# Patient Record
Sex: Female | Born: 1960 | Race: White | Hispanic: No | Marital: Married | Smoking: Never smoker
Health system: Southern US, Community
[De-identification: ages and names within clinical notes are randomized; demographics above are authoritative.]

## PROBLEM LIST (undated history)

## (undated) DIAGNOSIS — M722 Plantar fascial fibromatosis: Secondary | ICD-10-CM

## (undated) DIAGNOSIS — J309 Allergic rhinitis, unspecified: Secondary | ICD-10-CM

## (undated) DIAGNOSIS — S52123A Displaced fracture of head of unspecified radius, initial encounter for closed fracture: Secondary | ICD-10-CM

## (undated) DIAGNOSIS — G4700 Insomnia, unspecified: Secondary | ICD-10-CM

## (undated) DIAGNOSIS — J45909 Unspecified asthma, uncomplicated: Secondary | ICD-10-CM

## (undated) DIAGNOSIS — M1990 Unspecified osteoarthritis, unspecified site: Secondary | ICD-10-CM

## (undated) DIAGNOSIS — D509 Iron deficiency anemia, unspecified: Secondary | ICD-10-CM

## (undated) DIAGNOSIS — G2581 Restless legs syndrome: Secondary | ICD-10-CM

## (undated) DIAGNOSIS — M8580 Other specified disorders of bone density and structure, unspecified site: Secondary | ICD-10-CM

## (undated) DIAGNOSIS — F419 Anxiety disorder, unspecified: Secondary | ICD-10-CM

## (undated) DIAGNOSIS — K589 Irritable bowel syndrome without diarrhea: Secondary | ICD-10-CM

## (undated) DIAGNOSIS — K219 Gastro-esophageal reflux disease without esophagitis: Secondary | ICD-10-CM

## (undated) HISTORY — DX: Iron deficiency anemia, unspecified: D50.9

## (undated) HISTORY — DX: Unspecified osteoarthritis, unspecified site: M19.90

## (undated) HISTORY — PX: VAGINAL HYSTERECTOMY: SUR661

## (undated) HISTORY — DX: Allergic rhinitis, unspecified: J30.9

## (undated) HISTORY — DX: Restless legs syndrome: G25.81

## (undated) HISTORY — DX: Plantar fascial fibromatosis: M72.2

## (undated) HISTORY — DX: Displaced fracture of head of unspecified radius, initial encounter for closed fracture: S52.123A

## (undated) HISTORY — PX: OTHER SURGICAL HISTORY: SHX169

## (undated) HISTORY — PX: BREAST EXCISIONAL BIOPSY: SUR124

## (undated) HISTORY — PX: APPENDECTOMY: SHX54

## (undated) HISTORY — DX: Gastro-esophageal reflux disease without esophagitis: K21.9

## (undated) HISTORY — DX: Anxiety disorder, unspecified: F41.9

## (undated) HISTORY — DX: Irritable bowel syndrome, unspecified: K58.9

## (undated) HISTORY — DX: Insomnia, unspecified: G47.00

## (undated) HISTORY — DX: Other specified disorders of bone density and structure, unspecified site: M85.80

## (undated) HISTORY — PX: BREAST LUMPECTOMY: SHX2

## (undated) HISTORY — PX: OOPHORECTOMY: SHX86

---

## 1998-07-17 ENCOUNTER — Emergency Department (HOSPITAL_COMMUNITY): Admission: EM | Admit: 1998-07-17 | Discharge: 1998-07-17 | Payer: Self-pay | Admitting: Emergency Medicine

## 1998-09-18 ENCOUNTER — Inpatient Hospital Stay (HOSPITAL_COMMUNITY): Admission: EM | Admit: 1998-09-18 | Discharge: 1998-09-22 | Payer: Self-pay | Admitting: Emergency Medicine

## 1998-09-18 ENCOUNTER — Encounter: Payer: Self-pay | Admitting: Surgery

## 1998-11-01 ENCOUNTER — Ambulatory Visit (HOSPITAL_COMMUNITY): Admission: RE | Admit: 1998-11-01 | Discharge: 1998-11-01 | Payer: Self-pay | Admitting: Obstetrics and Gynecology

## 1998-11-01 ENCOUNTER — Encounter: Payer: Self-pay | Admitting: Obstetrics and Gynecology

## 1999-04-19 ENCOUNTER — Ambulatory Visit (HOSPITAL_COMMUNITY): Admission: RE | Admit: 1999-04-19 | Discharge: 1999-04-19 | Payer: Self-pay | Admitting: Obstetrics and Gynecology

## 1999-04-19 ENCOUNTER — Encounter: Payer: Self-pay | Admitting: Obstetrics and Gynecology

## 1999-05-10 ENCOUNTER — Ambulatory Visit (HOSPITAL_BASED_OUTPATIENT_CLINIC_OR_DEPARTMENT_OTHER): Admission: RE | Admit: 1999-05-10 | Discharge: 1999-05-10 | Payer: Self-pay | Admitting: Surgery

## 1999-10-12 ENCOUNTER — Other Ambulatory Visit: Admission: RE | Admit: 1999-10-12 | Discharge: 1999-10-12 | Payer: Self-pay | Admitting: Obstetrics and Gynecology

## 2000-01-18 ENCOUNTER — Encounter: Admission: RE | Admit: 2000-01-18 | Discharge: 2000-01-18 | Payer: Self-pay | Admitting: Internal Medicine

## 2000-01-21 ENCOUNTER — Encounter: Payer: Self-pay | Admitting: Internal Medicine

## 2000-01-21 ENCOUNTER — Encounter: Admission: RE | Admit: 2000-01-21 | Discharge: 2000-01-21 | Payer: Self-pay | Admitting: Internal Medicine

## 2000-05-27 ENCOUNTER — Other Ambulatory Visit: Admission: RE | Admit: 2000-05-27 | Discharge: 2000-05-27 | Payer: Self-pay | Admitting: Obstetrics and Gynecology

## 2000-05-28 ENCOUNTER — Encounter: Admission: RE | Admit: 2000-05-28 | Discharge: 2000-05-28 | Payer: Self-pay | Admitting: Obstetrics and Gynecology

## 2000-05-28 ENCOUNTER — Encounter: Payer: Self-pay | Admitting: Obstetrics and Gynecology

## 2000-06-12 ENCOUNTER — Ambulatory Visit (HOSPITAL_BASED_OUTPATIENT_CLINIC_OR_DEPARTMENT_OTHER): Admission: RE | Admit: 2000-06-12 | Discharge: 2000-06-12 | Payer: Self-pay | Admitting: General Surgery

## 2000-06-12 ENCOUNTER — Encounter (INDEPENDENT_AMBULATORY_CARE_PROVIDER_SITE_OTHER): Payer: Self-pay | Admitting: *Deleted

## 2000-10-13 ENCOUNTER — Other Ambulatory Visit: Admission: RE | Admit: 2000-10-13 | Discharge: 2000-10-13 | Payer: Self-pay | Admitting: Obstetrics and Gynecology

## 2001-04-14 ENCOUNTER — Encounter: Payer: Self-pay | Admitting: Obstetrics and Gynecology

## 2001-04-14 ENCOUNTER — Encounter: Admission: RE | Admit: 2001-04-14 | Discharge: 2001-04-14 | Payer: Self-pay | Admitting: Obstetrics and Gynecology

## 2001-10-14 ENCOUNTER — Other Ambulatory Visit: Admission: RE | Admit: 2001-10-14 | Discharge: 2001-10-14 | Payer: Self-pay | Admitting: Obstetrics and Gynecology

## 2001-12-25 ENCOUNTER — Encounter: Admission: RE | Admit: 2001-12-25 | Discharge: 2001-12-25 | Payer: Self-pay | Admitting: Internal Medicine

## 2001-12-25 ENCOUNTER — Encounter: Payer: Self-pay | Admitting: Internal Medicine

## 2002-02-12 ENCOUNTER — Encounter: Admission: RE | Admit: 2002-02-12 | Discharge: 2002-02-12 | Payer: Self-pay | Admitting: Internal Medicine

## 2002-02-12 ENCOUNTER — Encounter: Payer: Self-pay | Admitting: Internal Medicine

## 2002-04-15 ENCOUNTER — Encounter: Admission: RE | Admit: 2002-04-15 | Discharge: 2002-04-15 | Payer: Self-pay | Admitting: Obstetrics and Gynecology

## 2002-04-15 ENCOUNTER — Encounter: Payer: Self-pay | Admitting: Obstetrics and Gynecology

## 2002-10-26 ENCOUNTER — Other Ambulatory Visit: Admission: RE | Admit: 2002-10-26 | Discharge: 2002-10-26 | Payer: Self-pay | Admitting: Obstetrics and Gynecology

## 2003-01-27 ENCOUNTER — Encounter: Payer: Self-pay | Admitting: Internal Medicine

## 2003-01-27 ENCOUNTER — Ambulatory Visit (HOSPITAL_COMMUNITY): Admission: RE | Admit: 2003-01-27 | Discharge: 2003-01-27 | Payer: Self-pay | Admitting: Internal Medicine

## 2003-03-17 ENCOUNTER — Encounter: Payer: Self-pay | Admitting: *Deleted

## 2003-03-17 ENCOUNTER — Ambulatory Visit (HOSPITAL_COMMUNITY): Admission: RE | Admit: 2003-03-17 | Discharge: 2003-03-17 | Payer: Self-pay | Admitting: *Deleted

## 2003-04-19 ENCOUNTER — Encounter: Payer: Self-pay | Admitting: Obstetrics and Gynecology

## 2003-04-19 ENCOUNTER — Encounter: Admission: RE | Admit: 2003-04-19 | Discharge: 2003-04-19 | Payer: Self-pay | Admitting: Obstetrics and Gynecology

## 2003-11-22 ENCOUNTER — Other Ambulatory Visit: Admission: RE | Admit: 2003-11-22 | Discharge: 2003-11-22 | Payer: Self-pay | Admitting: Obstetrics and Gynecology

## 2004-01-20 ENCOUNTER — Emergency Department (HOSPITAL_COMMUNITY): Admission: AD | Admit: 2004-01-20 | Discharge: 2004-01-20 | Payer: Self-pay | Admitting: Family Medicine

## 2004-03-09 HISTORY — PX: KNEE SURGERY: SHX244

## 2004-04-26 ENCOUNTER — Encounter: Admission: RE | Admit: 2004-04-26 | Discharge: 2004-04-26 | Payer: Self-pay | Admitting: Obstetrics and Gynecology

## 2004-10-07 ENCOUNTER — Emergency Department (HOSPITAL_COMMUNITY): Admission: EM | Admit: 2004-10-07 | Discharge: 2004-10-07 | Payer: Self-pay | Admitting: Emergency Medicine

## 2004-11-22 ENCOUNTER — Other Ambulatory Visit: Admission: RE | Admit: 2004-11-22 | Discharge: 2004-11-22 | Payer: Self-pay | Admitting: Obstetrics and Gynecology

## 2005-04-29 ENCOUNTER — Encounter: Admission: RE | Admit: 2005-04-29 | Discharge: 2005-04-29 | Payer: Self-pay | Admitting: Obstetrics and Gynecology

## 2005-08-14 ENCOUNTER — Ambulatory Visit: Payer: Self-pay | Admitting: Internal Medicine

## 2005-09-11 ENCOUNTER — Ambulatory Visit: Payer: Self-pay | Admitting: Internal Medicine

## 2005-09-12 ENCOUNTER — Ambulatory Visit: Payer: Self-pay | Admitting: Internal Medicine

## 2005-11-11 ENCOUNTER — Ambulatory Visit: Payer: Self-pay | Admitting: Internal Medicine

## 2005-11-27 ENCOUNTER — Ambulatory Visit: Payer: Self-pay | Admitting: Internal Medicine

## 2005-12-04 ENCOUNTER — Other Ambulatory Visit: Admission: RE | Admit: 2005-12-04 | Discharge: 2005-12-04 | Payer: Self-pay | Admitting: Obstetrics and Gynecology

## 2005-12-06 ENCOUNTER — Encounter: Admission: RE | Admit: 2005-12-06 | Discharge: 2005-12-06 | Payer: Self-pay | Admitting: Obstetrics and Gynecology

## 2005-12-23 ENCOUNTER — Ambulatory Visit: Payer: Self-pay | Admitting: Internal Medicine

## 2006-01-24 ENCOUNTER — Ambulatory Visit: Payer: Self-pay | Admitting: Internal Medicine

## 2006-02-13 ENCOUNTER — Ambulatory Visit: Payer: Self-pay | Admitting: Internal Medicine

## 2006-04-09 ENCOUNTER — Encounter: Payer: Self-pay | Admitting: Surgery

## 2006-04-21 ENCOUNTER — Ambulatory Visit (HOSPITAL_COMMUNITY): Admission: RE | Admit: 2006-04-21 | Discharge: 2006-04-21 | Payer: Self-pay | Admitting: Obstetrics and Gynecology

## 2006-05-07 ENCOUNTER — Encounter: Admission: RE | Admit: 2006-05-07 | Discharge: 2006-05-07 | Payer: Self-pay | Admitting: Obstetrics and Gynecology

## 2006-06-10 ENCOUNTER — Ambulatory Visit: Payer: Self-pay | Admitting: Internal Medicine

## 2006-10-31 ENCOUNTER — Ambulatory Visit: Payer: Self-pay | Admitting: Internal Medicine

## 2006-12-05 ENCOUNTER — Other Ambulatory Visit: Admission: RE | Admit: 2006-12-05 | Discharge: 2006-12-05 | Payer: Self-pay | Admitting: Obstetrics and Gynecology

## 2007-01-21 ENCOUNTER — Inpatient Hospital Stay (HOSPITAL_COMMUNITY): Admission: AD | Admit: 2007-01-21 | Discharge: 2007-01-21 | Payer: Self-pay | Admitting: Obstetrics and Gynecology

## 2007-01-22 ENCOUNTER — Ambulatory Visit (HOSPITAL_BASED_OUTPATIENT_CLINIC_OR_DEPARTMENT_OTHER): Admission: RE | Admit: 2007-01-22 | Discharge: 2007-01-22 | Payer: Self-pay | Admitting: Obstetrics and Gynecology

## 2007-01-22 ENCOUNTER — Encounter (INDEPENDENT_AMBULATORY_CARE_PROVIDER_SITE_OTHER): Payer: Self-pay | Admitting: Specialist

## 2007-01-22 ENCOUNTER — Encounter (INDEPENDENT_AMBULATORY_CARE_PROVIDER_SITE_OTHER): Payer: Self-pay | Admitting: *Deleted

## 2007-04-09 ENCOUNTER — Ambulatory Visit: Payer: Self-pay | Admitting: Internal Medicine

## 2007-04-10 ENCOUNTER — Ambulatory Visit: Payer: Self-pay | Admitting: Internal Medicine

## 2007-05-15 ENCOUNTER — Encounter: Admission: RE | Admit: 2007-05-15 | Discharge: 2007-05-15 | Payer: Self-pay | Admitting: Obstetrics and Gynecology

## 2007-09-21 ENCOUNTER — Ambulatory Visit: Payer: Self-pay | Admitting: Internal Medicine

## 2007-12-10 HISTORY — PX: OTHER SURGICAL HISTORY: SHX169

## 2007-12-21 ENCOUNTER — Other Ambulatory Visit: Admission: RE | Admit: 2007-12-21 | Discharge: 2007-12-21 | Payer: Self-pay | Admitting: Obstetrics and Gynecology

## 2008-03-04 ENCOUNTER — Encounter (INDEPENDENT_AMBULATORY_CARE_PROVIDER_SITE_OTHER): Payer: Self-pay | Admitting: Urology

## 2008-03-04 ENCOUNTER — Ambulatory Visit (HOSPITAL_BASED_OUTPATIENT_CLINIC_OR_DEPARTMENT_OTHER): Admission: RE | Admit: 2008-03-04 | Discharge: 2008-03-04 | Payer: Self-pay | Admitting: Urology

## 2008-04-13 ENCOUNTER — Ambulatory Visit: Payer: Self-pay | Admitting: Gastroenterology

## 2008-04-13 LAB — CONVERTED CEMR LAB
AST: 25 units/L (ref 0–37)
Albumin: 4 g/dL (ref 3.5–5.2)
Bilirubin, Direct: 0.1 mg/dL (ref 0.0–0.3)
Calcium: 9.9 mg/dL (ref 8.4–10.5)
Chloride: 100 meq/L (ref 96–112)
Creatinine, Ser: 1 mg/dL (ref 0.4–1.2)
GFR calc non Af Amer: 63 mL/min
Glucose, Bld: 114 mg/dL — ABNORMAL HIGH (ref 70–99)
HCT: 42.4 % (ref 36.0–46.0)
Hemoglobin: 14.4 g/dL (ref 12.0–15.0)
MCHC: 33.9 g/dL (ref 30.0–36.0)
MCV: 89.1 fL (ref 78.0–100.0)
Monocytes Relative: 7.3 % (ref 3.0–12.0)
RBC: 4.75 M/uL (ref 3.87–5.11)
TSH: 2.15 microintl units/mL (ref 0.35–5.50)
Total Bilirubin: 0.6 mg/dL (ref 0.3–1.2)

## 2008-04-14 ENCOUNTER — Encounter: Payer: Self-pay | Admitting: Gastroenterology

## 2008-04-25 ENCOUNTER — Telehealth (INDEPENDENT_AMBULATORY_CARE_PROVIDER_SITE_OTHER): Payer: Self-pay | Admitting: *Deleted

## 2008-05-05 ENCOUNTER — Encounter: Payer: Self-pay | Admitting: Internal Medicine

## 2008-05-05 DIAGNOSIS — J939 Pneumothorax, unspecified: Secondary | ICD-10-CM | POA: Insufficient documentation

## 2008-05-05 DIAGNOSIS — N63 Unspecified lump in unspecified breast: Secondary | ICD-10-CM

## 2008-05-05 DIAGNOSIS — J330 Polyp of nasal cavity: Secondary | ICD-10-CM | POA: Insufficient documentation

## 2008-05-05 DIAGNOSIS — H1045 Other chronic allergic conjunctivitis: Secondary | ICD-10-CM

## 2008-05-05 DIAGNOSIS — J930 Spontaneous tension pneumothorax: Secondary | ICD-10-CM | POA: Insufficient documentation

## 2008-05-05 DIAGNOSIS — J302 Other seasonal allergic rhinitis: Secondary | ICD-10-CM

## 2008-05-09 ENCOUNTER — Encounter: Payer: Self-pay | Admitting: Gastroenterology

## 2008-05-10 ENCOUNTER — Telehealth: Payer: Self-pay | Admitting: Gastroenterology

## 2008-05-11 ENCOUNTER — Telehealth: Payer: Self-pay | Admitting: Internal Medicine

## 2008-05-12 ENCOUNTER — Encounter: Payer: Self-pay | Admitting: Gastroenterology

## 2008-05-12 ENCOUNTER — Ambulatory Visit (HOSPITAL_COMMUNITY): Admission: RE | Admit: 2008-05-12 | Discharge: 2008-05-12 | Payer: Self-pay | Admitting: Gastroenterology

## 2008-05-16 ENCOUNTER — Encounter: Admission: RE | Admit: 2008-05-16 | Discharge: 2008-05-16 | Payer: Self-pay | Admitting: Obstetrics and Gynecology

## 2008-05-18 ENCOUNTER — Ambulatory Visit: Payer: Self-pay | Admitting: Gastroenterology

## 2008-07-27 ENCOUNTER — Ambulatory Visit: Payer: Self-pay | Admitting: Internal Medicine

## 2008-08-02 ENCOUNTER — Telehealth: Payer: Self-pay | Admitting: Internal Medicine

## 2008-08-19 ENCOUNTER — Ambulatory Visit: Payer: Self-pay | Admitting: Internal Medicine

## 2008-08-30 ENCOUNTER — Ambulatory Visit: Payer: Self-pay | Admitting: Internal Medicine

## 2008-09-15 ENCOUNTER — Telehealth (INDEPENDENT_AMBULATORY_CARE_PROVIDER_SITE_OTHER): Payer: Self-pay | Admitting: *Deleted

## 2009-01-13 ENCOUNTER — Other Ambulatory Visit: Admission: RE | Admit: 2009-01-13 | Discharge: 2009-01-13 | Payer: Self-pay | Admitting: Obstetrics and Gynecology

## 2009-01-13 ENCOUNTER — Ambulatory Visit: Payer: Self-pay | Admitting: Obstetrics and Gynecology

## 2009-01-13 ENCOUNTER — Encounter: Payer: Self-pay | Admitting: Obstetrics and Gynecology

## 2009-01-24 ENCOUNTER — Ambulatory Visit: Payer: Self-pay | Admitting: Internal Medicine

## 2009-04-04 ENCOUNTER — Ambulatory Visit: Payer: Self-pay | Admitting: Internal Medicine

## 2009-05-17 ENCOUNTER — Encounter: Admission: RE | Admit: 2009-05-17 | Discharge: 2009-05-17 | Payer: Self-pay | Admitting: Internal Medicine

## 2009-09-07 ENCOUNTER — Ambulatory Visit: Payer: Self-pay | Admitting: Obstetrics and Gynecology

## 2009-10-05 ENCOUNTER — Ambulatory Visit (HOSPITAL_COMMUNITY): Admission: RE | Admit: 2009-10-05 | Discharge: 2009-10-05 | Payer: Self-pay | Admitting: Obstetrics and Gynecology

## 2009-10-30 ENCOUNTER — Ambulatory Visit: Payer: Self-pay | Admitting: Internal Medicine

## 2009-12-06 ENCOUNTER — Ambulatory Visit: Payer: Self-pay | Admitting: Internal Medicine

## 2010-01-03 ENCOUNTER — Ambulatory Visit: Payer: Self-pay | Admitting: Internal Medicine

## 2010-01-19 ENCOUNTER — Ambulatory Visit: Payer: Self-pay | Admitting: Obstetrics and Gynecology

## 2010-01-19 ENCOUNTER — Other Ambulatory Visit: Admission: RE | Admit: 2010-01-19 | Discharge: 2010-01-19 | Payer: Self-pay | Admitting: Obstetrics and Gynecology

## 2010-07-20 ENCOUNTER — Encounter: Admission: RE | Admit: 2010-07-20 | Discharge: 2010-07-20 | Payer: Self-pay | Admitting: Obstetrics and Gynecology

## 2010-08-02 ENCOUNTER — Ambulatory Visit: Payer: Self-pay | Admitting: Internal Medicine

## 2010-08-24 ENCOUNTER — Ambulatory Visit: Payer: Self-pay | Admitting: Obstetrics and Gynecology

## 2010-09-28 ENCOUNTER — Ambulatory Visit: Payer: Self-pay | Admitting: Obstetrics and Gynecology

## 2010-12-30 ENCOUNTER — Encounter: Payer: Self-pay | Admitting: Obstetrics and Gynecology

## 2011-01-08 NOTE — Assessment & Plan Note (Signed)
Summary: rov/apc   Primary Provider/Referring Provider:  Johnella Moloney  CC:  follow up visit.  History of Present Illness: 08/09/08- 50 year old woman returning for follow-up of allergic rhinitis and allergy vaccine therapy. she considers allergy vaccine 8 "tremendous help.".  She gives her own injections at 1:50 and demonstrates good understanding of risk and EpiPen.  In the past, the dose has been held at 1:50 because of local reaction.  She still gets some induration at the injection site, but it does not bother her. She noticed a scratchy throat, some postnasal drainage, burning and itching of dry eyes.  She has lachrymal plugs.  The eyes are still dry.  Uses hydroxyzine, for interstitial cystitis, and for allergy, taken at bedtime.  Ears get stopped.  Insignificant chest symptoms, including cough, or wheeze.  Denies purulent or bloody discharge. Has moved to live in a renovated old farm house, which is quite dusty.  She and her children ride horses and are heavily exposed to the usual environment of the horse stalls.  She has cats and dogs. She cannot tolerate decongestants because they overstimulate her.  Tussionex has been tolerated and very helpful if needed for viral syndrome  chest colds.  January 03, 2010- Allergic rhinitis, conjunctivitis Very dry eyes even with lacrimal duct occlusion. Has to avoid all antihistamines. She continues allergy vaccine at 1:10 without problems. Gets local reaction she puts up with. She feels shots help enough to be worth it. Never needed epipen  but we discussed policy and safety. No asthma.    Current Medications (verified): 1)  Celexa 20 Mg Tabs (Citalopram Hydrobromide) .... Take 1 By Mouth Once Daily 2)  Klonopin 0.5 Mg  Tabs (Clonazepam) .... Qhs 3)  Tussionex Pennkinetic Er 8-10 Mg/28ml Lqcr (Chlorpheniramine-Hydrocodone) .Marland Kitchen.. 1 Tsp Twice Daily If Needed 4)  Clonazepam 1 Mg Tabs (Clonazepam) 5)  Allergy Vaccine 1:50 G.o. (W-E) .... Weekly 6)   Epipen 0.3 Mg/0.70ml (1:1000) Devi (Epinephrine Hcl (Anaphylaxis)) .... For Severe Allergic Reaction 7)  Allergy Vaccine 1:10 Go (W-E) .... Advance To 1:10 Next Order, Build As Tol. 8)  Vitamin D (Ergocalciferol) 50000 Unit Caps (Ergocalciferol) .... Take 1 By Mouth Weekly 9)  Fish Oil 1000 Mg Caps (Omega-3 Fatty Acids) .... Take 1 By Mouth Once Daily 10)  Osteo Bi-Flex Adv Triple St  Tabs (Misc Natural Products) .... Take 1 By Mouth Once Daily 11)  Calcium 600 1500 Mg Tabs (Calcium Carbonate) .... Take 1 By Mouth Once Daily  Allergies (verified): 1)  ! Neosporin 2)  ! Zoloft 3)  ! * Decongestants 4)  ! * Bandaids 5)  * White Petroleum Jelly  Past History:  Past Medical History: Last updated: 08/19/2008 BREAST MASS, BENIGN (ICD-611.72) PNEUMOTHORAX (ICD-512.8) ALLERGIC CONJUNCTIVITIS (ICD-372.14) NASAL POLYP (ICD-471.0) ALLERGIC RHINITIS (ICD-477.9) Pneumomediastinum/ left pneumothorax 1997- hit in chest with tennis ball Interstitial cystitis endometriosis, irritable bowel OCD/ Anxiety disorder  Past Surgical History: Last updated: 08/19/2008 Appendectomy Knee Surgery Benign Breast Lumps twice Hysterectomy oophorectomy  Family History: Last updated: 08/19/2008 OCD Sister- allergic rhinitis and asthma FHX- HTN, DM, CVA   Social History: Last updated: 08/19/2008 Patient never smoked.  Works [employer], Tourist information centre manager remarried- grown natural and adopted younger children  Risk Factors: Smoking Status: never (08/19/2008)  Review of Systems      See HPI  The patient denies anorexia, fever, weight loss, weight gain, vision loss, decreased hearing, hoarseness, chest pain, syncope, dyspnea on exertion, peripheral edema, prolonged cough, headaches, hemoptysis, and severe indigestion/heartburn.  Vital Signs:  Patient profile:   50 year old female Height:      61 inches Weight:      116.13 pounds BMI:     22.02 O2 Sat:      98 % on Room  air Pulse rate:   89 / minute BP sitting:   104 / 68  (left arm) Cuff size:   regular  Vitals Entered By: Reynaldo Minium CMA (January 03, 2010 9:32 AM)  O2 Flow:  Room air  Physical Exam  Additional Exam:  General: A/Ox3; pleasant and cooperative, NAD, thin SKIN: no rash, lesions NODES: no lymphadenopathy HEENT: /AT, EOM- WNL, Conjuctivae- clear, PERRLA, TM-WNL, Nose- clear, Throat- clear and wnl, [place]  II NECK: Supple w/ fair ROM, JVD- none, normal carotid impulses w/o bruits Thyroid- normal to palpation CHEST: Clear to P&A HEART: RRR, no m/g/r heard ABDOMEN: Soft and nl;  ZHY:QMVH, nl pulses, no edema  NEURO: Grossly intact to observation      Impression & Recommendations:  Problem # 1:  ALLERGIC RHINITIS (ICD-477.9)  Good control and understanding. Her dry eyes limit use of antihistamines. She dropped Singulair and is satisfied with present treatment.  Problem # 2:  ALLERGIC CONJUNCTIVITIS (ICD-372.14) Dryness is of more concern than direct allergic symptoms and limits use of antihistamines.  Medications Added to Medication List This Visit: 1)  Celexa 20 Mg Tabs (Citalopram hydrobromide) .... Take 1 by mouth once daily 2)  Vitamin D (ergocalciferol) 50000 Unit Caps (Ergocalciferol) .... Take 1 by mouth weekly 3)  Fish Oil 1000 Mg Caps (Omega-3 fatty acids) .... Take 1 by mouth once daily 4)  Osteo Bi-flex Adv Triple St Tabs (Misc natural products) .... Take 1 by mouth once daily 5)  Calcium 600 1500 Mg Tabs (Calcium carbonate) .... Take 1 by mouth once daily  Other Orders: Est. Patient Level III (84696)  Patient Instructions: 1)  Schedule return in one year, earlier if needed 2)  Script for Tussionex 3)  [facility] 4)  We will continue allergy vaccine at 1:10. If local reactions bother you, try dropping volume to 0.4 ml/ vial/week Prescriptions: EPIPEN 0.3 MG/0.3ML (1:1000) DEVI (EPINEPHRINE HCL (ANAPHYLAXIS)) For  severe allergic reaction  #1 x prn   Entered and Authorized by:   Waymon Budge MD   Signed by:   Waymon Budge MD on 01/03/2010   Method used:   Electronically to        [id number] (ret[facility])       [address]       [place], [place]  [address]       Ph: [phone]       Fax: [phone]   RxID:   [id number] TUSSIONEX PENNKINETIC ER 8-10 MG/5ML LQCR (CHLORPHENIRAMINE-HYDROCODONE) 1 tsp twice daily if needed  #200 ml x 0   Entered and Authorized by:   Waymon Budge MD   Signed by:   Waymon Budge MD on 01/03/2010   Method used:   Print then Give to Patient   RxID:   [id number]

## 2011-01-25 ENCOUNTER — Encounter (INDEPENDENT_AMBULATORY_CARE_PROVIDER_SITE_OTHER): Payer: 59 | Admitting: Obstetrics and Gynecology

## 2011-01-25 DIAGNOSIS — Z01419 Encounter for gynecological examination (general) (routine) without abnormal findings: Secondary | ICD-10-CM

## 2011-01-25 DIAGNOSIS — E039 Hypothyroidism, unspecified: Secondary | ICD-10-CM

## 2011-04-05 ENCOUNTER — Ambulatory Visit (INDEPENDENT_AMBULATORY_CARE_PROVIDER_SITE_OTHER): Payer: 59

## 2011-04-05 DIAGNOSIS — M810 Age-related osteoporosis without current pathological fracture: Secondary | ICD-10-CM

## 2011-04-23 NOTE — Op Note (Signed)
[name], [name]               ACCOUNT NO.:  [id number]   MEDICAL RECORD NO.:  [id number]          PATIENT TYPE:  AMB   LOCATION:  [facility]                         FACILITY[facility]   PHYSICIAN:  Jamison Neighbor, M.D.  DATE OF BIRTH:  01-Jul-1961   DATE OF PROCEDURE:  03/04/2008  DATE OF DISCHARGE:                               OPERATIVE REPORT   SERVICE:  Urology.   PREOPERATIVE DIAGNOSIS:  Chronic pelvic pain, possible interstitial  cystitis.   POSTOPERATIVE DIAGNOSIS:  Chronic pelvic pain, possible interstitial  cystitis.   PROCEDURE:  Cystoscopy, urethral calibration, hydrodistention of the  bladder, Marcaine and Pyridium installation, Marcaine and Kenalog  injection.   SURGEON:  Dr. Marcelyn Bruins.   ANESTHESIA:  General.   COMPLICATIONS:  None.   DRAINS:  None.   BRIEF HISTORY:  This 50 year old female has a longstanding history of  lower abdominal pain and chronic urinary symptoms including frequency  and urgency.  There is also a past question of some urinary  incontinence. The patient known is known to have irritable bowel  syndrome and it was felt that she might indeed have interstitial  cystitis.  The patient certainly has signs and symptoms of interstitial  cystitis.  She is now to undergo cystoscopy and hydrodistention. She  understands the risks and benefits of the procedure and gave full  informed consent.   PROCEDURE:  After successful induction of general anesthesia, the  patient was placed in the dorsal lithotomy position, prepped with  Betadine and draped in the usual sterile fashion. Careful bimanual  examination revealed a modest cystocele and a modest rectocele but good  support for her vault.  She had a urethra that was slightly tight bit  was easily dilated to 32-French with female urethral sounds with no  signs of diverticulum or other irregularities.  The cystoscope was  inserted.  The bladder was carefully inspected.  No tumors or stones  could  be seen.  Both ureteral orifices were normal in configuration and  location.  Minimal squamous metaplasia was identified but was felt to be  nonpathologic. Hydrodistention of the bladder was performed and the  bladder was distended at a pressure of 100 cm of water for 5 minutes.  When the bladder was drained, no granulations could be identified.  The  bladder capacity was normal at 1300 mL. There were no ulcers seen, there  was no bleeding and this certainly would be felt to be not consistent  with interstitial cystitis.  A bladder biopsy was done. This will be  sent for a mast cell analysis as well as to rule out carcinoma in situ.  The biopsy site was cauterized.  A mixture of Marcaine and Pyridium was  left in the bladder, Marcaine and Kenalog were injected periurethrally.  The patient tolerated the procedure well and was taken to the recovery  room in good condition.  She will be sent home with Lorcet Plus,  Pyridium Plus and doxycycline. She will return to see me in follow-up.  At that point, we will review the results of her bladder biopsy. If the  biopsy does not show mast cells and if there is no change in her  symptoms with the hydrodistention, I would feel that those two facts  combined with the normal bladder capacity and appearance of the bladder  would certainly rule out interstitial cystitis.  If on the other hand  she has a lot of mast cells and/or any kind of a response to  hydrodistention, I would consider treating her in that fashion. Going  forward, she will need an additional evaluation with urodynamics to  evaluate this incontinence which I suspect is primarily an urge  incontinence.  If she continues to fail oral therapy, she certainly may  be a candidate for neural modulation with the InterStim device.  This  will be discussed with her at follow-up depending on the results of her  hydrodistention and the results of upcoming urodynamics.      Jamison Neighbor,  M.D.  Electronically Signed     RJE/MEDQ  D:  03/04/2008  T:  03/05/2008  Job:  [id number]   cc:   Candyce Churn, M.D.  Fax: [phone]   Rande Brunt. Eda Paschal, M.D.  Fax: [phone]

## 2011-04-23 NOTE — Assessment & Plan Note (Signed)
[facility]                         GASTROENTEROLOGY OFFICE NOTE   [name], [name]                        MRN:          [id number]  DATE:04/13/2008                            DOB:          11/09/1961    REFERRING PHYSICIAN:  Jamison Neighbor, M.D.   PRIMARY CARE PHYSICIAN:  Candyce Churn, M.D.   REASON FOR REFERRAL:  Dr. Logan Bores asked me to evaluate [name] in  consultation regarding lower abdominal cramping, chronic constipation.   HPI:  [name] is a very pleasant 50 year old woman, who has had  chronic left lower quadrant pains that are intermittent in nature for at  least 30 years.  She says they will always come out in times of stress.  She will describe them as cramping discomforts in her left lower  quadrant.  She has had troubles with constipation for at least a decade,  as well.  She has tried fiber supplements without much help, MiraLax for  a brief trial without much help, and for the past three to four years,  she has been on daily Senokot Plus pills two a day.  She says she has to  really push and strain to move her bowels.   She has a vast gynecologic and urologic history, including having a  hysterectomy, tubal ligation, oophorectomy, endometriosis.  There is a  suspicion that she has interstitial cystitis.  She believes if she did  not take laxatives she would only move her bowels twice a week.   REVIEW OF SYSTEMS:  Notable for probable ten-pound weight-loss in the  past eight months, although she has a history of bulimia and so weight  is difficult to judge on her.  The rest of her review of systems is  essentially negative and is available on her nursing intake sheet.   PAST MEDICAL HISTORY:  Bulimia, last vomiting one month ago.  Anxiety,  panic disorder.  Status post hysterectomy, status post tubal ligation,  status post breast surgery.  Appendectomy nine years ago.  Endometriosis.  Bladder distention and biopsy.   Probable interstitial  cystitis.   CURRENT MEDICATIONS:  Luvox, Klonopin, Estradiol, Protonix, Atarax,  hydrochlorothiazide, multivitamin, Senokot.   ALLERGIES:  To NEOSPORIN, ZOLOFT, DECONGESTANTS and WHITE PETROLEUM.   SOCIAL HISTORY:  Married with four children.  Works as a Psychologist, prison and probation services.  Nonsmoker, nondrinker.   FAMILY HISTORY:  No colon cancer in family.  Her mother has colon  polyps.   PHYSICAL EXAM:  Patient is 5 feet 1 inch, 114 pounds.  Blood pressure  100/64, pulse 64.  CONSTITUTIONAL:  Generally well-appearing.  NEUROLOGIC:  Alert and oriented times three.  EYES:  Extraocular movements intact.  MOUTH:  Oropharynx moist, no lesions.  NECK:  Supple, no lymphadenopathy.  CARDIOVASCULAR/HEART:  Regular rate and rhythm.  LUNGS:  Clear to auscultation bilaterally.  ABDOMEN:  Soft, nontender, nondistended.  Normal bowel sounds.  EXTREMITIES:  No lower extremity edema.  SKIN:  No rashes or lesions on visible extremities.   ASSESSMENT AND PLAN:  Patient is a 50 year old woman with chronic (30  years) left lower  quadrant pain, chronic (10 years) constipation.   I think her symptoms are mainly functional.  She has, however, been on  Senokot laxatives daily for three to four years and she is perhaps  somewhat dependent on them now.  I recommended she try to come off those  and transition to fiber supplement, Citrucel, on a daily basis.  She  should certainly undergo colonoscopy at her soonest convenience for her  chronic constipation, her family history of colon polyps.  I would like  to do that at [facility] with propofol, given her extreme anxiety and  pain issues.  I would also like to wait five to six weeks to do that to  see how she responds, at least initially, to the fiber supplements.     [name] Fee, MD  Electronically Signed    DPJ/MedQ  DD: 04/13/2008  DT: 04/13/2008  Job #: [id number]   cc:   Candyce Churn, M.D.

## 2011-04-26 NOTE — Op Note (Signed)
[name], [name]               ACCOUNT NO.:  [id number]   MEDICAL RECORD NO.:  [id number]          PATIENT TYPE:  AMB   LOCATION:                               FACILITY:  [facility]   PHYSICIAN:  Daniel L. Gottsegen, M.D.DATE OF BIRTH:  Sep 09, 1961   DATE OF PROCEDURE:  01/22/2007  DATE OF DISCHARGE:  01/22/2007                               OPERATIVE REPORT   PREOPERATIVE DIAGNOSIS:  Pelvic pain, dyspareunia, endometriosis.   POSTOPERATIVE DIAGNOSIS:  Pelvic pain, dyspareunia, endometriosis, plus  left ovarian cyst.   OPERATION:  Diagnostic laparoscopy with bilateral salpingo-oophorectomy.   SURGEON:  Daniel L. Eda Paschal, M.D.   FIRST ASSISTANT:  Timothy P. Fontaine, M.D.   INDICATIONS:  The patient is a 50 year old female with progressive  history over the past year of pelvic pain and dyspareunia.  On  ultrasound she has had adnexal enlargements that seem to come and to.  She had had a vaginal hysterectomy in the 1990s which showed  endometriosis and it is suspected that she has recurrent endometriosis  causing the above symptoms.  She had been treated with Depo-Lupron was  some improvement in her symptoms although not complete improvement.  She  now enters the hospital for diagnostic laparoscopy, bilateral salpingo-  oophorectomy and excision of any obvious endometriosis.   FINDINGS AT SURGERY:  The patient's left ovary was enlarged to 6+  centimeters with a large ovarian cyst.  When the cyst fluid was  aspirated, it was straw-colored and appeared to be consistent with a  functional cyst rather than and endometrioma, it certainly also could be  a serous cystadenoma of the left ovary.  The patient is status post  bilateral Falope ring.  She had two Falope rings on either tube.  The  ovaries themselves were free.  There were some windows in the cul-de-sac  but no obvious endometriosis was seen and some of the windows was felt  to be related to her previous modified McCall's  done in association with  her vaginal hysterectomy.   PROCEDURE:  After adequate general orotracheal anesthesia the patient  was placed in dorsal lithotomy position, prepped and draped in usual  sterile manner.  A Foley catheter was inserted into her bladder and a  sponge stick was placed in her vagina.  The 10-mm laparoscope was  introduced under direct vision with OptiVu without any trauma.  Pneumoperitoneum was created 5 mm ports were placed in the right and  left lower quadrant.  Through this an accessory instrumentation was  placed.  First peritoneal washings were obtained.  Next attention was  turned to the left adnexa which was significantly enlarged from the  cyst.  It could be elevated.  The ureter could be identified.  The IP  ligament was bipolared and cut.  The rest of the attachments of the  adnexa to the broad ligament and to the bladder flap were bipolared and  cut without traumatizing any structures.  Attention was next turned to  the right side.  The procedure was repeated.  Once again the ureter  could be well identified.  The IP ligament  was bipolared and cut as were  all the other attachments.  Both specimens were now in the cul-de-sac.  A 5 mm scope was placed in the right lower quadrant.  An Endopouch was  placed through the umbilicus.  Both specimens could be put in the  Endopouch and they were delivered outside the skin.  At this point cyst  fluid was aspirated without leaking it back in the peritoneal cavity in  order to make the specimen small enough to deliver through the  subumbilical laparoscopic incision.  This was done with ease and all the  above specimens were sent to pathology.  Specimens sent included  peritoneal washings,left ovarian cyst fluid, left ovary and tube, right  ovary and tube.  The pelvis was then inspected.  There was no bleeding.  We could see no evidence of active endometriosis.  The windows  previously described were left in place  because we thought they were due  to her modified McCall's and were not did active endometriosis.  The  pneumoperitoneum evacuated.  The subumbilical fascial incision was  closed with figure-of-eights of 0-0 Vicryl.  The left lower quadrant  incision was bleeding and this was closed with 4-0 Monocryl and  Dermabond was used in the other two incisions.  The Foley catheter was  removed.  It was draining clear urine and the patient left the operating  satisfactory condition.  Blood loss was less than 100 mL.      Daniel L. Eda Paschal, M.D.  Electronically Signed     DLG/MEDQ  D:  01/22/2007  T:  01/22/2007  Job:  [id number]

## 2011-04-26 NOTE — Assessment & Plan Note (Signed)
[facility]                             PULMONARY OFFICE NOTE   NAME:[name], [name]                        MRN:          [id number]  DATE:04/09/2007                            DOB:          September 22, 1961    PROBLEM LIST:  1. Allergic rhinitis.  2. Nasal polyposis.  3. Allergic conjunctivitis.   HISTORY OF PRESENT ILLNESS:  First visit since 2006. She works in [place]  and has difficulty getting back and forth. Has had no problems at all  with allergy vaccine. We reviewed risk, benefit, and goals, anaphylaxis  issues of administration outside of a medical office, had epinephrine.  Epi-Pen is refilled. Eyes are bothering her some now in peak pollen  season but she says vaccine has been a bit help. She is not having any  significant problems with her chest but says that her ear hurts and pops  occasionally. She cannot tolerated Sudafed because of stimulation.  Tussionex seems to relieve the ear discomfort but she does not notice  much cough.   MEDICATIONS:  Luvox 1/2 b.i.d., Klonopin 1 q.h.s., Nexium 40 mg.   ALLERGIES:  NEOSPORIN (rash), BAND-AID'S (rash), ZOLOFT, DECONGESTANTS  (raise her heart rate.)   MEDICATIONS:  Vaccine, Allegra 60 mg b.i.d., Nasonex, Estradiol, p.r.n.  use of Xanax, Tussionex, Epi-Pen available.   OBJECTIVE:  VITAL SIGNS:  Weight 127 pounds, blood pressure 122/66,  pulse 77, room air saturation 98%.  HEENT:  I do not see nasal polyps. Nasal mucosa looks pretty good.  Conjunctivae are clear.  LUNGS:  Fields are clear.  HEART:  Sounds are normal.   IMPRESSION:  Allergic rhinitis, allergic conjunctivitis, probable  intermittent eustachian dysfunction.   PLAN:  1. Epi-Pen was discussed again.  2. Continue vaccine at 1 to 50, reconsider later.  3. Cromolyn ophthalmic solution 1 drop each eye, up to q.i.d. p.r.n.  4. Try Singulair 20 mg, sample.  5. We refilled Tussionex 5 ml q.12 hours for occasional use only.  6. Schedule  return in 1 year but earlier p.r.n.     Clinton D. Maple Hudson, MD, Tonny Bollman, FACP  Electronically Signed    CDY/MedQ  DD: 04/11/2007  DT: 04/11/2007  Job #: [id number]   cc:   Candyce Churn, M.D.

## 2011-04-26 NOTE — Consult Note (Signed)
[name], [name] NO.:  [id number]   MEDICAL RECORD NO.:  [id number]          PATIENT TYPE:  EMS   LOCATION:  ED                           FACILITY:  [facility]   PHYSICIAN:  Erasmo Leventhal, M.D.DATE OF BIRTH:  1961/07/04   DATE OF CONSULTATION:  10/07/2004  DATE OF DISCHARGE:                                   CONSULTATION   HISTORY OF PRESENT ILLNESS:  [name] is a 50 year old Caucasian female,  patient of Dr. Ollen Gross.  On Wednesday of this week, she had a right  knee arthroscopy.  She noted on Friday she had some increased pain and  swelling.  She called the office and then she was recommended to [place].  She  called the on call physician yesterday and he made some recommendations.  She called me today.  She states that she had a temperature up to 100, her  knee felt warm, and she was having increasing pain.  I asked her to come to  the emergency room to be evaluated by myself.   In the emergency room, I evaluated the patient.  She seemed comfortable.  She was accompanied by her sister.  She was laughing at times.  She did not  appear systemically ill.  She is complaining only of right knee pain.   MEDICATIONS:  She is taking Mepergan Fortes for pain.  Her other medication  is Xanax p.r.n. and Klonopin for anxiety disorder.   PHYSICAL EXAMINATION:  VITAL SIGNS:  Temperature is 98.5 orally.  GENERAL:  She is awake, alert, and oriented to person, place, time, and  circumstance.  She seems very comfortable.  EXTREMITIES:  Her right lower extremity is examined.  Calf is not swollen  and nontender.  Negative Homans'.  No evidence of DVT.  Compartment is soft.  Neurovascular examination is intact.  The knee has 6 by 8 cm of erythema, 2+  effusion, and slightly warm to touch.  Mild periportal dried blood was  present and there was some early swelling blisters anterolaterally.  Popliteal fossa was benign.   IMPRESSION:  At this time, I felt that the  patient probably had a  hemarthrosis and recommended.  She agreed to proceed.   DESCRIPTION OF PROCEDURE:  Betadine alcohol prep.  Anesthetized with 5 cc of  1% lidocaine.  Aspirated the knee and removed 40 cc of a postoperative  hemarthrosis.  Decompressed the joint.  There was no evidence of pus nor  infection at all and she felt much better after this.  Portals were then  cleaned.  She had a sterile dressing applied.  There were no complications  with the procedure.   LABORATORY DATA:  A CBC was obtained.  She had a normal white count of 9.5  thousand without a left shift.   IMPRESSION:  Right knee hemarthrosis.   RECOMMENDATIONS:  Aspiration as above with decompression.  Prophylactic  antibiotic with Keflex 500 mg t.i.d.  Renew the prescription for Mepergan  Fortes 1-2 q.4-6h. p.r.n. pain.  Recommend ibuprofen at 400 mg q.6h.  She will be discharged from the emergency  room in an improved condition.  She will follow up in the office of Dr. Homero Fellers Aluisio in two days.  She will  call between now and then if she is having problems.  All questions were  encouraged and answered.  Follow-up in my office.      RAC/MEDQ  D:  10/07/2004  T:  10/07/2004  Job:  [id number]

## 2011-07-02 ENCOUNTER — Other Ambulatory Visit: Payer: Self-pay | Admitting: Obstetrics and Gynecology

## 2011-07-02 DIAGNOSIS — Z1231 Encounter for screening mammogram for malignant neoplasm of breast: Secondary | ICD-10-CM

## 2011-07-29 ENCOUNTER — Ambulatory Visit
Admission: RE | Admit: 2011-07-29 | Discharge: 2011-07-29 | Disposition: A | Discharge status: Home / Self Care | Payer: 59 | Source: Ambulatory Visit | Attending: Obstetrics and Gynecology | Admitting: Obstetrics and Gynecology

## 2011-07-29 DIAGNOSIS — Z1231 Encounter for screening mammogram for malignant neoplasm of breast: Secondary | ICD-10-CM

## 2011-07-30 ENCOUNTER — Encounter: Payer: Self-pay | Admitting: *Deleted

## 2011-07-30 ENCOUNTER — Other Ambulatory Visit: Payer: Self-pay | Admitting: Obstetrics and Gynecology

## 2011-07-30 ENCOUNTER — Ambulatory Visit (INDEPENDENT_AMBULATORY_CARE_PROVIDER_SITE_OTHER): Payer: 59 | Admitting: Gynecology

## 2011-07-30 ENCOUNTER — Encounter: Payer: Self-pay | Admitting: Gynecology

## 2011-07-30 ENCOUNTER — Telehealth: Payer: Self-pay | Admitting: *Deleted

## 2011-07-30 DIAGNOSIS — N6459 Other signs and symptoms in breast: Secondary | ICD-10-CM

## 2011-07-30 DIAGNOSIS — M810 Age-related osteoporosis without current pathological fracture: Secondary | ICD-10-CM | POA: Insufficient documentation

## 2011-07-30 DIAGNOSIS — N644 Mastodynia: Secondary | ICD-10-CM

## 2011-07-30 DIAGNOSIS — E559 Vitamin D deficiency, unspecified: Secondary | ICD-10-CM | POA: Insufficient documentation

## 2011-07-30 NOTE — Progress Notes (Signed)
Patient presents complaining of a several week history of left breast discomfort and on recent exam she thought that she could feel a small lump at the 3:00 position off of her left nipple. She actually went for her mammogram screening yesterday told them about her breast discomfort and they told her to see her physician first.  She is status post a vaginal hysterectomy for prolapse and then subsequent BSO for recurrent pain and is on estradiol ERT. She had questions about whether she should wean off of her ERT.  Exam Breasts examined lying and sitting Right without masses retractions discharge adenopathy Left without masses retractions discharge adenopathy. The area she's pointing to is at 3:00 off the areola and I feel normal underlying breast tissue and no abnormalities.  Assessment and plan: Left breast discomfort over the last several weeks with patient felt questionable mass in the left with no abnormalities on physician exam. I think with patient's feeling is normal breast tissue. We'll start with a diagnostic mammography bilaterally an ultrasound of the left breast just to clear the area of her concern. Assuming normal then we'll plan expected management with self breast exams. If any abnormalities then we'll triage based on results. The patient and I had a lengthy discussion about ERT, WHI study, ACOG and NAMS recommendations. The patient feels strongly she wants a trial of weaning and she is going to go ahead and wean herself off of the next 2 weeks and see how she does from a symptom standpoint. The risks benefits of continuing her estrogen versus discontinuing were reviewed and she understands the issues. She is due for her annual with Dr. Eda Paschal in February and will followup for this.

## 2011-07-30 NOTE — Telephone Encounter (Signed)
Pt called with finding a breast lump. And requested a dx mammo. i advised our protocol is to see patients first before scheduling to make sure the appropriate appt is made. Patient scheduled for an appt today with dr Audie Box

## 2011-07-31 ENCOUNTER — Ambulatory Visit
Admission: RE | Admit: 2011-07-31 | Discharge: 2011-07-31 | Disposition: A | Discharge status: Home / Self Care | Payer: 59 | Source: Ambulatory Visit | Attending: Gynecology | Admitting: Gynecology

## 2011-07-31 DIAGNOSIS — N644 Mastodynia: Secondary | ICD-10-CM

## 2011-08-07 ENCOUNTER — Other Ambulatory Visit: Payer: Self-pay

## 2011-08-07 MED ORDER — ERGOCALCIFEROL 1.25 MG (50000 UT) PO CAPS: ORAL_CAPSULE | ORAL | Status: DC

## 2011-08-08 NOTE — Telephone Encounter (Signed)
FAXED VIT. D 50,000 IU  RX TO [facility] AFTER DR. Reece Agar SIGNED FAX.

## 2011-08-29 ENCOUNTER — Other Ambulatory Visit: Payer: Self-pay

## 2011-08-29 MED ORDER — ESTRADIOL 1 MG PO TABS: 1.0000 mg | ORAL_TABLET | Freq: Every day | ORAL | Status: DC

## 2011-08-29 NOTE — Telephone Encounter (Signed)
SENT RX ELECTRONICALLY PER PT. REQUEST.

## 2011-09-02 LAB — POCT I-STAT 4, (NA,K, GLUC, HGB,HCT)
Glucose, Bld: 81
Operator id: 268271
Potassium: 3.5

## 2011-09-05 LAB — BASIC METABOLIC PANEL
CO2: 31
Calcium: 9.2
Chloride: 95 — ABNORMAL LOW
Creatinine, Ser: 1.04
Glucose, Bld: 78

## 2011-10-08 ENCOUNTER — Other Ambulatory Visit: Payer: Self-pay | Admitting: *Deleted

## 2011-10-08 ENCOUNTER — Encounter: Payer: Self-pay | Admitting: *Deleted

## 2011-10-08 DIAGNOSIS — M810 Age-related osteoporosis without current pathological fracture: Secondary | ICD-10-CM

## 2011-10-08 NOTE — Progress Notes (Signed)
  Patient to have calcium drawn to get Prolia done.  Set for 10/18/11.

## 2011-10-18 ENCOUNTER — Other Ambulatory Visit: Payer: 59

## 2011-11-05 ENCOUNTER — Telehealth: Payer: Self-pay | Admitting: *Deleted

## 2011-11-05 NOTE — Telephone Encounter (Signed)
Lm for patient to call about rescheduling labs for Prolia.  (Had missed appt on 10/18/11 )

## 2012-02-03 ENCOUNTER — Encounter: Payer: Self-pay | Admitting: Obstetrics and Gynecology

## 2012-02-03 ENCOUNTER — Ambulatory Visit (INDEPENDENT_AMBULATORY_CARE_PROVIDER_SITE_OTHER): Payer: 59 | Admitting: Obstetrics and Gynecology

## 2012-02-03 VITALS — BP 112/76 | Ht 60.5 in | Wt 113.0 lb

## 2012-02-03 DIAGNOSIS — Z01419 Encounter for gynecological examination (general) (routine) without abnormal findings: Secondary | ICD-10-CM

## 2012-02-03 DIAGNOSIS — Z3049 Encounter for surveillance of other contraceptives: Secondary | ICD-10-CM

## 2012-02-03 MED ORDER — MINIVELLE 0.05 MG/24HR TD PTTW: 1.0000 | MEDICATED_PATCH | TRANSDERMAL | Status: DC

## 2012-02-03 MED ORDER — RISEDRONATE SODIUM 150 MG PO TABS: ORAL_TABLET | ORAL | Status: DC

## 2012-02-03 NOTE — Telephone Encounter (Signed)
Patient has annual exam today.  Will see if patient wants to proceed with Prolia or not.

## 2012-02-03 NOTE — Progress Notes (Signed)
Patient came to see me today for her annual GYN exam. We have been treating her for osteoporosis. She done Reclast one year. She done Prolia one year. She's had a lot of muscular pain with both of them. She's been off medication for one year. She is up-to-date on mammograms. She's doing well with oral estrogen. She is having no vaginal bleeding. She is having no pelvic pain. She is due for her bone density this summer. She takes calcium and vitamin D. She has had no fractures.  HEENT: Within normal limits.  Kennon Portela present. Neck: No masses. Supraclavicular lymph nodes: Not enlarged. Breasts: Examined in both sitting and lying position. Symmetrical without skin changes or masses. Abdomen: Soft no masses guarding or rebound. No hernias. Pelvic: External within normal limits. BUS within normal limits. Vaginal examination shows good estrogen effect, no cystocele enterocele or rectocele. Cervix and uterus absent. Adnexa within normal limits. Rectovaginal confirmatory. Extremities within normal limits.  Assessment: #1. Menopausal symptoms #2. Osteoporosis  Plan: Switched her to estrogen patch for safety. Switch her to Actonel 150 mg monthly with hopes that she won't have the same side effects.

## 2012-02-06 ENCOUNTER — Telehealth: Payer: Self-pay | Admitting: *Deleted

## 2012-02-06 MED ORDER — RISEDRONATE SODIUM 150 MG PO TABS: ORAL_TABLET | ORAL | Status: DC

## 2012-02-06 MED ORDER — ESTRADIOL 0.05 MG/24HR TD PTTW: MEDICATED_PATCH | TRANSDERMAL | Status: DC

## 2012-02-06 NOTE — Telephone Encounter (Signed)
Pt was given minvelle dot 0.05 mg on 02/03/12 at [facility]. pt was also given coupon to help with cost,she will pay $130.00 with out coupon. Pt can't use coupon because she has mail order pharmacy. Pt was told if this happens to call and let you know and you will give her another rx? Please advise

## 2012-02-06 NOTE — Telephone Encounter (Signed)
Pt informed with the below message rx sent to pharmacy. She also wanted Actonel rx sent to pharmacy. medication got sent to wrong pharmacy at office visit.

## 2012-02-06 NOTE — Telephone Encounter (Signed)
Vivelle dot patch 0.05 mg one patch twice a week.

## 2012-03-31 ENCOUNTER — Other Ambulatory Visit: Payer: Self-pay

## 2012-03-31 MED ORDER — ERGOCALCIFEROL 1.25 MG (50000 UT) PO CAPS: ORAL_CAPSULE | ORAL | Status: DC

## 2012-06-17 ENCOUNTER — Telehealth: Payer: Self-pay | Admitting: *Deleted

## 2012-06-17 MED ORDER — ESTRADIOL 0.05 MG/24HR TD PTTW: MEDICATED_PATCH | TRANSDERMAL | Status: DC

## 2012-06-17 NOTE — Telephone Encounter (Signed)
Pt called requesting refill on her vivelle dot patch 0.5mg , 3 month supply sent to pharmacy.

## 2012-07-15 ENCOUNTER — Other Ambulatory Visit: Payer: Self-pay | Admitting: *Deleted

## 2012-07-15 ENCOUNTER — Other Ambulatory Visit: Payer: Self-pay | Admitting: Obstetrics and Gynecology

## 2012-07-15 DIAGNOSIS — M810 Age-related osteoporosis without current pathological fracture: Secondary | ICD-10-CM

## 2012-07-16 ENCOUNTER — Other Ambulatory Visit: Payer: Self-pay | Admitting: Obstetrics and Gynecology

## 2012-07-16 DIAGNOSIS — Z1231 Encounter for screening mammogram for malignant neoplasm of breast: Secondary | ICD-10-CM

## 2012-08-05 ENCOUNTER — Ambulatory Visit
Admission: RE | Admit: 2012-08-05 | Discharge: 2012-08-05 | Disposition: A | Discharge status: Home / Self Care | Payer: 59 | Source: Ambulatory Visit | Attending: Obstetrics and Gynecology | Admitting: Obstetrics and Gynecology

## 2012-08-05 DIAGNOSIS — Z1231 Encounter for screening mammogram for malignant neoplasm of breast: Secondary | ICD-10-CM

## 2012-08-05 DIAGNOSIS — M810 Age-related osteoporosis without current pathological fracture: Secondary | ICD-10-CM

## 2012-08-07 ENCOUNTER — Other Ambulatory Visit: Payer: Self-pay | Admitting: Obstetrics and Gynecology

## 2012-08-07 MED ORDER — ALENDRONATE SODIUM 70 MG PO TABS: 70.0000 mg | ORAL_TABLET | ORAL | Status: AC

## 2012-08-12 ENCOUNTER — Other Ambulatory Visit: Payer: Self-pay | Admitting: *Deleted

## 2012-08-12 DIAGNOSIS — R928 Other abnormal and inconclusive findings on diagnostic imaging of breast: Secondary | ICD-10-CM

## 2012-08-12 DIAGNOSIS — N63 Unspecified lump in unspecified breast: Secondary | ICD-10-CM

## 2012-08-13 ENCOUNTER — Ambulatory Visit
Admission: RE | Admit: 2012-08-13 | Discharge: 2012-08-13 | Disposition: A | Discharge status: Home / Self Care | Payer: 59 | Source: Ambulatory Visit | Attending: Obstetrics and Gynecology | Admitting: Obstetrics and Gynecology

## 2012-08-13 ENCOUNTER — Encounter: Payer: Self-pay | Admitting: Obstetrics and Gynecology

## 2012-08-13 DIAGNOSIS — N63 Unspecified lump in unspecified breast: Secondary | ICD-10-CM

## 2012-10-20 ENCOUNTER — Telehealth: Payer: Self-pay | Admitting: *Deleted

## 2012-10-20 NOTE — Telephone Encounter (Signed)
Dr Reece Agar received an overdue order for a Calcium level. She had previously been on Prolia and Reclast but currently on Fosomax. Dr Reece Agar wanted me to call her and fu to see how she is doing on it. I left message for the patient.

## 2012-11-10 NOTE — Telephone Encounter (Signed)
Pt never called back KW  

## 2013-07-01 ENCOUNTER — Other Ambulatory Visit: Payer: Self-pay

## 2013-07-01 DIAGNOSIS — Z1231 Encounter for screening mammogram for malignant neoplasm of breast: Secondary | ICD-10-CM

## 2013-08-10 ENCOUNTER — Ambulatory Visit
Admission: RE | Admit: 2013-08-10 | Discharge: 2013-08-10 | Disposition: A | Discharge status: Home / Self Care | Payer: 59 | Source: Ambulatory Visit

## 2013-08-10 DIAGNOSIS — Z1231 Encounter for screening mammogram for malignant neoplasm of breast: Secondary | ICD-10-CM

## 2013-08-16 ENCOUNTER — Telehealth: Payer: Self-pay | Admitting: Internal Medicine

## 2013-08-16 NOTE — Telephone Encounter (Signed)
Pt can come in on Tuesday 08-17-13 at 10:45am for 11:00am CONSULT with CY(been over 3 years). Thanks.

## 2013-08-16 NOTE — Telephone Encounter (Signed)
Spoke with patient-- Patient has been scheduled for Tues 08/17/13 and is aware to arrive at 1045 for 11am appt  Verified office location with patient and nothing further needed at this time.

## 2013-08-16 NOTE — Telephone Encounter (Signed)
I spoke with pt. She stated she last saw [facility] 01/03/10. She is having some allergy problems. She c/o cough, sore throat, nasal congestion x 3-4 weeks now. She saw pcp on Saturday and thought it could be walking PNA but was not sure. She is requesting to get in and see Dr. Maple Hudson sooner than next available. Please advise Dr. Maple Hudson thanks  Allergies  Allergen Reactions  . Cephalexin   . Codeine   . Erythromycin   . Neomycin-Bacitracin Zn-Polymyx     REACTION: RASH  . Petrol Dist-Piperonyl Butoxide-Pyrethrins [Pyrethrins-Piperonyl Butoxide]   . Promethazine Hcl   . Restasis [Cyclosporine]   . Sertraline Hcl

## 2013-08-16 NOTE — Telephone Encounter (Signed)
Ok to see what we can do to schedule her, [name]

## 2013-08-17 ENCOUNTER — Encounter: Payer: Self-pay | Admitting: Internal Medicine

## 2013-08-17 ENCOUNTER — Ambulatory Visit (INDEPENDENT_AMBULATORY_CARE_PROVIDER_SITE_OTHER)
Admission: RE | Admit: 2013-08-17 | Discharge: 2013-08-17 | Disposition: A | Discharge status: Home / Self Care | Payer: 59 | Source: Ambulatory Visit | Attending: Internal Medicine | Admitting: Internal Medicine

## 2013-08-17 ENCOUNTER — Ambulatory Visit (INDEPENDENT_AMBULATORY_CARE_PROVIDER_SITE_OTHER): Payer: 59 | Admitting: Internal Medicine

## 2013-08-17 ENCOUNTER — Other Ambulatory Visit: Payer: 59

## 2013-08-17 VITALS — BP 114/68 | HR 77 | Ht 61.0 in | Wt 119.0 lb

## 2013-08-17 DIAGNOSIS — J302 Other seasonal allergic rhinitis: Secondary | ICD-10-CM

## 2013-08-17 DIAGNOSIS — J309 Allergic rhinitis, unspecified: Secondary | ICD-10-CM

## 2013-08-17 DIAGNOSIS — K219 Gastro-esophageal reflux disease without esophagitis: Secondary | ICD-10-CM

## 2013-08-17 DIAGNOSIS — R05 Cough: Secondary | ICD-10-CM

## 2013-08-17 DIAGNOSIS — R059 Cough, unspecified: Secondary | ICD-10-CM

## 2013-08-17 DIAGNOSIS — J209 Acute bronchitis, unspecified: Secondary | ICD-10-CM

## 2013-08-17 MED ORDER — MOMETASONE FUROATE 50 MCG/ACT NA SUSP: 2.0000 | Freq: Every day | NASAL | Status: DC

## 2013-08-17 NOTE — Patient Instructions (Addendum)
Suggest you try taking extra acid blocker-    Nexium each morning before breakfast    OTC omeprazole or famotidine/ Pepcid before supper. Try this for a month, to see what impact it has on your throat discomfort.  Sample Nasonex nasal spray-  2 puffs each nostril once daily at bedtime  Ok to use a saline nasal spray during the day as needed  Order- CXR    Dx cough              Lab- Allergy Profile  Dx Seasonal Allergic Rhinitis

## 2013-08-17 NOTE — Progress Notes (Signed)
08/17/13- 52 yoF never smoker Self referral-former patient for allergies and was on Allergy injections up until 2011. Off of allergy vaccine now for 3 years. Has noted occasional symptoms controlled with Zyrtec. Over the past month has had seasonal onset of throat tickle and malaise. Frequent cough and chest congestion with tussive sharp substernal pains. Scant green sputum in the mornings but no wheezing. Maxillary sinus pressure bilaterally. Frequent reflux and heartburn controlled with Nexium. She just started back on Zyrtec. Has old Nasacort. She is interested in updating allergy evaluation .She is married, living [place] house[place] acres with cat and 2 dogs, nearby horse.  Prior to Admission medications   Medication Sig Start Date End Date Taking? Authorizing Provider  ALPRAZolam (XANAX PO) Take by mouth. Prn     Yes Historical Provider, MD  Calcium Carbonate-Vitamin D (CALCIUM + D PO) Take by mouth. 2 a day    Yes Historical Provider, MD  Citalopram Hydrobromide (CELEXA PO) Take by mouth.     Yes Historical Provider, MD  ergocalciferol (VITAMIN D2) 50000 UNITS capsule TAKES ONE PILL BY MOUTH EVERY OTHER WEEK 03/31/12  Yes Trellis Paganini, MD  estradiol (VIVELLE-DOT) 0.05 MG/24HR Place one patch twice a week. 06/17/12  Yes Trellis Paganini, MD  NEXIUM 40 MG capsule Take 1 capsule by mouth daily. 08/11/13  Yes Historical Provider, MD  pramipexole (MIRAPEX) 0.25 MG tablet Take 1 tablet by mouth daily. 08/09/13  Yes Historical Provider, MD  mometasone (NASONEX) 50 MCG/ACT nasal spray Place 2 sprays into the nose daily. 08/17/13   Waymon Budge, MD   Past Medical History  Diagnosis Date  . Uterine prolapse   . Endometriosis   . Osteoporosis   . Unspecified vitamin D deficiency   . Ovarian cyst    Past Surgical History  Procedure Laterality Date  . Vaginal hysterectomy    . Appendectomy    . Knee surgery  04,05  . Diaganostic laparoscopy bso    . Bladder distention  2009  .  Repair of rectocele    . Breast lumpectomy      Benign   Family History  Problem Relation Age of Onset  . Hypertension Mother   . Diabetes Father   . Hypertension Father   . Heart disease Father   . Hypertension Sister   . Heart disease Paternal Uncle   . Diabetes Paternal Grandmother   . Heart disease Paternal Grandmother   . Diabetes Maternal Grandmother   . Heart disease Maternal Grandmother   . Diabetes Maternal Grandfather   . Heart disease Maternal Grandfather   . Diabetes Paternal Grandfather   . Heart disease Paternal Grandfather    History   Social History  . Marital Status: Married    Spouse Name: N/A    Number of Children: N/A  . Years of Education: N/A   Occupational History  . Not on file.   Social History Main Topics  . Smoking status: Never Smoker   . Smokeless tobacco: Never Used  . Alcohol Use: Yes     Comment: rare  . Drug Use: No  . Sexual Activity: Yes    Birth Control/ Protection: Surgical   Other Topics Concern  . Not on file   Social History Narrative  . No narrative on file   ROS-see HPI Constitutional:   No-   weight loss, night sweats, fevers, chills, fatigue, lassitude. HEENT:   +  headaches, difficulty swallowing, tooth/dental problems, +sore throat,  No-  sneezing, itching, +ear ache, +nasal congestion, post nasal drip,  CV:  No-   chest pain, orthopnea, PND, swelling in lower extremities, anasarca, dizziness, palpitations Resp: +  shortness of breath with exertion or at rest.              + productive cough,  + non-productive cough,  No- coughing up of blood.              + change in color of mucus.  No- wheezing.   Skin: No-   rash or lesions. GI:  No-   heartburn, indigestion, abdominal pain, nausea, vomiting, diarrhea,                 change in bowel habits, loss of appetite GU: No-   dysuria, change in color of urine, no urgency or frequency.  No- flank pain. MS:  No-   joint pain or swelling.  No- decreased range of  motion.  No- back pain. Neuro-     nothing unusual Psych:  No- change in mood or affect. No depression or anxiety.  No memory loss.  OBJ- Physical Exam General- Alert, Oriented, Affect-appropriate, Distress- none acute, small/ trim Skin- rash-none, lesions- none, excoriation- none Lymphadenopathy- none Head- atraumatic            Eyes- Gross vision intact, PERRLA, conjunctivae and secretions clear            Ears- Hearing, canals-normal            Nose- Clear, no-Septal dev, mucus, polyps, erosion, perforation             Throat- Mallampati II , mucosa clear , drainage- none, tonsils- atrophic Neck- flexible , trachea midline, no stridor , thyroid nl, carotid no bruit Chest - symmetrical excursion , unlabored           Heart/CV- RRR , no murmur , no gallop  , no rub, nl s1 s2                           - JVD- none , edema- none, stasis changes- none, varices- none           Lung- clear to P&A, wheeze- none, cough+dry , dullness-none, rub- none           Chest wall-  Abd- tender-no, distended-no, bowel sounds-present, HSM- no Br/ Gen/ Rectal- Not done, not indicated Extrem- cyanosis- none, clubbing, none, atrophy- none, strength- nl Neuro- grossly intact to observation

## 2013-08-18 LAB — ALLERGY FULL PROFILE
Allergen, D pternoyssinus,d7: <0.1 kU/L
Allergen,Goose feathers, e70: <0.1 kU/L
Alternaria Alternata: <0.1 kU/L
Box Elder IgE: <0.1 kU/L
Cat Dander: <0.1 kU/L
Common Ragweed: 0.12 kU/L — ABNORMAL HIGH
Dog Dander: <0.1 kU/L
G005 Rye, Perennial: <0.1 kU/L
Goldenrod: <0.1 kU/L
Helminthosporium halodes: 0.16 kU/L — ABNORMAL HIGH
House Dust Hollister: <0.1 kU/L
IgE (Immunoglobulin E), Serum: 36.9 IU/mL (ref 0.0–180.0)
Lamb's Quarters: <0.1 kU/L
Plantain: <0.1 kU/L
Stemphylium Botryosum: <0.1 kU/L
Sycamore Tree: <0.1 kU/L

## 2013-08-19 NOTE — Progress Notes (Signed)
Quick Note:  lmtcb ______ 

## 2013-08-23 NOTE — Progress Notes (Signed)
Quick Note:  Pt aware of results. ______ 

## 2013-08-24 DIAGNOSIS — K219 Gastro-esophageal reflux disease without esophagitis: Secondary | ICD-10-CM | POA: Insufficient documentation

## 2013-08-24 DIAGNOSIS — J209 Acute bronchitis, unspecified: Secondary | ICD-10-CM | POA: Insufficient documentation

## 2013-08-24 NOTE — Assessment & Plan Note (Addendum)
Allergy skin tests 2006- positive for grass weed and tree pollens, dust mite, several molds. She was on allergy vaccine for mixed grass pollens, several weeds, mixed tree pollens, house dust and some molds Plan-Nasonex, lab for Allergy Profile

## 2013-08-24 NOTE — Assessment & Plan Note (Signed)
There is a component of reflux, not clear how important it is to her airway symptoms. Plan-recommend twice daily omeprazole for a month to notice effect on symptoms

## 2013-08-24 NOTE — Assessment & Plan Note (Signed)
She is concerned about her upper airway although she is not a smoker. We discussed options and will chest x-ray

## 2013-10-01 ENCOUNTER — Ambulatory Visit: Payer: 59 | Admitting: Internal Medicine

## 2014-07-15 ENCOUNTER — Other Ambulatory Visit: Payer: Self-pay

## 2014-07-15 DIAGNOSIS — Z1231 Encounter for screening mammogram for malignant neoplasm of breast: Secondary | ICD-10-CM

## 2014-08-11 ENCOUNTER — Ambulatory Visit
Admission: RE | Admit: 2014-08-11 | Discharge: 2014-08-11 | Disposition: A | Discharge status: Home / Self Care | Payer: 59 | Source: Ambulatory Visit

## 2014-08-11 DIAGNOSIS — Z1231 Encounter for screening mammogram for malignant neoplasm of breast: Secondary | ICD-10-CM

## 2014-09-23 ENCOUNTER — Other Ambulatory Visit: Payer: Self-pay

## 2014-10-10 ENCOUNTER — Encounter: Payer: Self-pay | Admitting: Internal Medicine

## 2014-12-09 HISTORY — PX: KNEE SURGERY: SHX244

## 2014-12-09 HISTORY — PX: REFRACTIVE SURGERY: SHX103

## 2015-02-20 ENCOUNTER — Encounter: Payer: Self-pay | Admitting: Gynecology

## 2015-04-19 ENCOUNTER — Encounter: Payer: Self-pay | Admitting: Gynecology

## 2015-04-19 ENCOUNTER — Other Ambulatory Visit (HOSPITAL_COMMUNITY)
Admission: RE | Admit: 2015-04-19 | Discharge: 2015-04-19 | Disposition: A | Discharge status: Home / Self Care | Payer: BLUE CROSS/BLUE SHIELD | Source: Ambulatory Visit | Attending: Gynecology | Admitting: Gynecology

## 2015-04-19 ENCOUNTER — Ambulatory Visit (INDEPENDENT_AMBULATORY_CARE_PROVIDER_SITE_OTHER): Payer: BLUE CROSS/BLUE SHIELD | Admitting: Gynecology

## 2015-04-19 VITALS — BP 120/74 | Ht 61.0 in | Wt 122.0 lb

## 2015-04-19 DIAGNOSIS — Z01419 Encounter for gynecological examination (general) (routine) without abnormal findings: Secondary | ICD-10-CM | POA: Insufficient documentation

## 2015-04-19 DIAGNOSIS — Z79890 Hormone replacement therapy (postmenopausal): Secondary | ICD-10-CM

## 2015-04-19 DIAGNOSIS — M810 Age-related osteoporosis without current pathological fracture: Secondary | ICD-10-CM | POA: Diagnosis not present

## 2015-04-19 LAB — HEMOGLOBIN A1C
Hgb A1c MFr Bld: 5.4 % (ref <5.7)
Mean Plasma Glucose: 108 mg/dL (ref <117)

## 2015-04-19 MED ORDER — ESTRADIOL 0.05 MG/24HR TD PTTW: MEDICATED_PATCH | TRANSDERMAL | Status: DC

## 2015-04-19 NOTE — Patient Instructions (Signed)
Follow up for bone density as scheduled.  You may obtain a copy of any labs that were done today by logging onto MyChart as outlined in the instructions provided with your AVS (after visit summary). The office will not call with normal lab results but certainly if there are any significant abnormalities then we will contact you.   Health Maintenance, Female A healthy lifestyle and preventative care can promote health and wellness.  Maintain regular health, dental, and eye exams.  Eat a healthy diet. Foods like vegetables, fruits, whole grains, low-fat dairy products, and lean protein foods contain the nutrients you need without too many calories. Decrease your intake of foods high in solid fats, added sugars, and salt. Get information about a proper diet from your caregiver, if necessary.  Regular physical exercise is one of the most important things you can do for your health. Most adults should get at least 150 minutes of moderate-intensity exercise (any activity that increases your heart rate and causes you to sweat) each week. In addition, most adults need muscle-strengthening exercises on 2 or more days a week.   Maintain a healthy weight. The body mass index (BMI) is a screening tool to identify possible weight problems. It provides an estimate of body fat based on height and weight. Your caregiver can help determine your BMI, and can help you achieve or maintain a healthy weight. For adults 20 years and older:  A BMI below 18.5 is considered underweight.  A BMI of 18.5 to 24.9 is normal.  A BMI of 25 to 29.9 is considered overweight.  A BMI of 30 and above is considered obese.  Maintain normal blood lipids and cholesterol by exercising and minimizing your intake of saturated fat. Eat a balanced diet with plenty of fruits and vegetables. Blood tests for lipids and cholesterol should begin at age 20 and be repeated every 5 years. If your lipid or cholesterol levels are high, you are over  50, or you are a high risk for heart disease, you may need your cholesterol levels checked more frequently.Ongoing high lipid and cholesterol levels should be treated with medicines if diet and exercise are not effective.  If you smoke, find out from your caregiver how to quit. If you do not use tobacco, do not start.  Lung cancer screening is recommended for adults aged 55 80 years who are at high risk for developing lung cancer because of a history of smoking. Yearly low-dose computed tomography (CT) is recommended for people who have at least a 30-pack-year history of smoking and are a current smoker or have quit within the past 15 years. A pack year of smoking is smoking an average of 1 pack of cigarettes a day for 1 year (for example: 1 pack a day for 30 years or 2 packs a day for 15 years). Yearly screening should continue until the smoker has stopped smoking for at least 15 years. Yearly screening should also be stopped for people who develop a health problem that would prevent them from having lung cancer treatment.  If you are pregnant, do not drink alcohol. If you are breastfeeding, be very cautious about drinking alcohol. If you are not pregnant and choose to drink alcohol, do not exceed 1 drink per day. One drink is considered to be 12 ounces (355 mL) of beer, 5 ounces (148 mL) of wine, or 1.5 ounces (44 mL) of liquor.  Avoid use of street drugs. Do not share needles with anyone. Ask for help   if you need support or instructions about stopping the use of drugs.  High blood pressure causes heart disease and increases the risk of stroke. Blood pressure should be checked at least every 1 to 2 years. Ongoing high blood pressure should be treated with medicines, if weight loss and exercise are not effective.  If you are 55 to 54 years old, ask your caregiver if you should take aspirin to prevent strokes.  Diabetes screening involves taking a blood sample to check your fasting blood sugar level.  This should be done once every 3 years, after age 45, if you are within normal weight and without risk factors for diabetes. Testing should be considered at a younger age or be carried out more frequently if you are overweight and have at least 1 risk factor for diabetes.  Breast cancer screening is essential preventative care for women. You should practice "breast self-awareness." This means understanding the normal appearance and feel of your breasts and may include breast self-examination. Any changes detected, no matter how small, should be reported to a caregiver. Women in their 20s and 30s should have a clinical breast exam (CBE) by a caregiver as part of a regular health exam every 1 to 3 years. After age 40, women should have a CBE every year. Starting at age 40, women should consider having a mammogram (breast X-ray) every year. Women who have a family history of breast cancer should talk to their caregiver about genetic screening. Women at a high risk of breast cancer should talk to their caregiver about having an MRI and a mammogram every year.  Breast cancer gene (BRCA)-related cancer risk assessment is recommended for women who have family members with BRCA-related cancers. BRCA-related cancers include breast, ovarian, tubal, and peritoneal cancers. Having family members with these cancers may be associated with an increased risk for harmful changes (mutations) in the breast cancer genes BRCA1 and BRCA2. Results of the assessment will determine the need for genetic counseling and BRCA1 and BRCA2 testing.  The Pap test is a screening test for cervical cancer. Women should have a Pap test starting at age 21. Between ages 21 and 29, Pap tests should be repeated every 2 years. Beginning at age 30, you should have a Pap test every 3 years as long as the past 3 Pap tests have been normal. If you had a hysterectomy for a problem that was not cancer or a condition that could lead to cancer, then you no  longer need Pap tests. If you are between ages 65 and 70, and you have had normal Pap tests going back 10 years, you no longer need Pap tests. If you have had past treatment for cervical cancer or a condition that could lead to cancer, you need Pap tests and screening for cancer for at least 20 years after your treatment. If Pap tests have been discontinued, risk factors (such as a new sexual partner) need to be reassessed to determine if screening should be resumed. Some women have medical problems that increase the chance of getting cervical cancer. In these cases, your caregiver may recommend more frequent screening and Pap tests.  The human papillomavirus (HPV) test is an additional test that may be used for cervical cancer screening. The HPV test looks for the virus that can cause the cell changes on the cervix. The cells collected during the Pap test can be tested for HPV. The HPV test could be used to screen women aged 30 years and older, and   should be used in women of any age who have unclear Pap test results. After the age of 30, women should have HPV testing at the same frequency as a Pap test.  Colorectal cancer can be detected and often prevented. Most routine colorectal cancer screening begins at the age of 50 and continues through age 75. However, your caregiver may recommend screening at an earlier age if you have risk factors for colon cancer. On a yearly basis, your caregiver may provide home test kits to check for hidden blood in the stool. Use of a small camera at the end of a tube, to directly examine the colon (sigmoidoscopy or colonoscopy), can detect the earliest forms of colorectal cancer. Talk to your caregiver about this at age 50, when routine screening begins. Direct examination of the colon should be repeated every 5 to 10 years through age 75, unless early forms of pre-cancerous polyps or small growths are found.  Hepatitis C blood testing is recommended for all people born from  1945 through 1965 and any individual with known risks for hepatitis C.  Practice safe sex. Use condoms and avoid high-risk sexual practices to reduce the spread of sexually transmitted infections (STIs). Sexually active women aged 25 and younger should be checked for Chlamydia, which is a common sexually transmitted infection. Older women with new or multiple partners should also be tested for Chlamydia. Testing for other STIs is recommended if you are sexually active and at increased risk.  Osteoporosis is a disease in which the bones lose minerals and strength with aging. This can result in serious bone fractures. The risk of osteoporosis can be identified using a bone density scan. Women ages 65 and over and women at risk for fractures or osteoporosis should discuss screening with their caregivers. Ask your caregiver whether you should be taking a calcium supplement or vitamin D to reduce the rate of osteoporosis.  Menopause can be associated with physical symptoms and risks. Hormone replacement therapy is available to decrease symptoms and risks. You should talk to your caregiver about whether hormone replacement therapy is right for you.  Use sunscreen. Apply sunscreen liberally and repeatedly throughout the day. You should seek shade when your shadow is shorter than you. Protect yourself by wearing long sleeves, pants, a wide-brimmed hat, and sunglasses year round, whenever you are outdoors.  Notify your caregiver of new moles or changes in moles, especially if there is a change in shape or color. Also notify your caregiver if a mole is larger than the size of a pencil eraser.  Stay current with your immunizations. Document Released: 06/10/2011 Document Revised: 03/22/2013 Document Reviewed: 06/10/2011 Exit[employer] Patient Information 2014 [employer].   

## 2015-04-19 NOTE — Addendum Note (Signed)
Addended by: Dayna BarkerGARDNER, Gunda Maqueda K on: 04/19/2015 09:28 AM   Modules accepted: Orders

## 2015-04-19 NOTE — Progress Notes (Signed)
[name] 04/24/1961 [id number]        54 y.o.  [id number] for annual exam.  Former patient Dr. Eda PaschalGottsegen. Has been seeing her primary physician for GYN care with her last visit here 2013. Several issues noted below.  Past medical history,surgical history, problem list, medications, allergies, family history and social history were all reviewed and documented as reviewed in the EPIC chart.  ROS:  Performed with pertinent positives and negatives included in the history, assessment and plan.   Additional significant findings :  none   Exam: [facility] personassistant Filed Vitals:   04/19/15 0821  BP: 120/74  Height: 5\' 1"  (1.549 m)  Weight: 122 lb (55.339 kg)   General appearance:  Normal affect, orientation and appearance. Skin: Grossly normal HEENT: Without gross lesions.  No cervical or supraclavicular adenopathy. Thyroid normal.  Lungs:  Clear without wheezing, rales or rhonchi Cardiac: RR, without RMG Abdominal:  Soft, nontender, without masses, guarding, rebound, organomegaly or hernia Breasts:  Examined lying and sitting without masses, retractions, discharge or axillary adenopathy. Pelvic:  Ext/BUS/vagina normal  Adnexa  Without masses or tenderness    Anus and perineum  Normal   Rectovaginal  Normal sphincter tone without palpated masses or tenderness.    Assessment/Plan:  54 y.o. [id number] female for annual exam.   1. Postmenopausal/HRT. Status post TVH posterior repair for prolapse and rectocele in the early 2000's. Subsequent laparoscopic BSO 2008 for benign ovarian cysts.  Currently on Vivelle 0.05 mg patch. Doing well and wants to continue. Tried weaning with significant hot flashes and sweats. I reviewed with her the WHI study and increased risk of stroke heart attack DVT and breast cancer. Also reviewed the most recent [place] study showing higher risk of heart attack and stroke in the first year of weaning from HRT. Benefits as far as her osteoporosis also reviewed. After a lengthy  discussion the patient wants to continue and I refilled her 1 year. 2. Osteoporosis. DEXA 2013 T score -2.3. Prior DEXA 2011 T score -2.6. Received 1 course of Reclast and one course of Prolia. Had a lot of musculoskeletal discomfort with both. No other treatment. Check baseline DEXA now. Check baseline vitamin D as she has been noted to be deficient in the past. Discussed treatment options after DEXA results. Benefits of ERT reviewed. 3. Pap smear 2013. No history of abnormal Pap smears previously. Options to stop screening she is status post hysterectomy for benign indications discussed. Patient is uncomfortable with this and I did a Pap smear this year. 4. Colonoscopy 2009 with planned repeat interval 10 years. 5. Mammography 08/2014. Continue with annual mammography. SBE monthly reviewed. 6. Intermittent numbness along the outer aspect of her left large toe. Comes and goes. Exam is normal.  No other neurologic signs or muscle weakness. No low back pain headaches visual or CNS type symptoms. Offered referral to neurologist. Patient declined and prefers to monitor this time. 7. Health maintenance. No routine blood work done as she has this done at Dr. Kevan NyGates office. Follow up in one year, sooner as needed.     Dara LordsFONTAINE,Oumou Smead P MD, 9:01 AM 04/19/2015

## 2015-04-20 LAB — URINALYSIS W MICROSCOPIC + REFLEX CULTURE
BACTERIA UA: NONE SEEN
Bilirubin Urine: NEGATIVE
CRYSTALS: NONE SEEN
Casts: NONE SEEN
Glucose, UA: NEGATIVE mg/dL
HGB URINE DIPSTICK: NEGATIVE
KETONES UR: NEGATIVE mg/dL
Leukocytes, UA: NEGATIVE
NITRITE: NEGATIVE
PROTEIN: NEGATIVE mg/dL
SPECIFIC GRAVITY, URINE: 1.005 (ref 1.005–1.030)
Squamous Epithelial / LPF: NONE SEEN
UROBILINOGEN UA: 0.2 mg/dL (ref 0.0–1.0)
pH: 6 (ref 5.0–8.0)

## 2015-04-20 LAB — VITAMIN D 25 HYDROXY (VIT D DEFICIENCY, FRACTURES): Vit D, 25-Hydroxy: 26 ng/mL — ABNORMAL LOW (ref 30–100)

## 2015-04-20 LAB — CYTOLOGY - PAP

## 2015-04-21 ENCOUNTER — Other Ambulatory Visit: Payer: Self-pay | Admitting: Gynecology

## 2015-04-21 DIAGNOSIS — E559 Vitamin D deficiency, unspecified: Secondary | ICD-10-CM

## 2015-05-11 ENCOUNTER — Encounter: Payer: Self-pay | Admitting: Gynecology

## 2015-05-11 ENCOUNTER — Ambulatory Visit (INDEPENDENT_AMBULATORY_CARE_PROVIDER_SITE_OTHER): Payer: BLUE CROSS/BLUE SHIELD

## 2015-05-11 ENCOUNTER — Other Ambulatory Visit: Payer: Self-pay | Admitting: Gynecology

## 2015-05-11 ENCOUNTER — Telehealth: Payer: Self-pay | Admitting: Gynecology

## 2015-05-11 DIAGNOSIS — M810 Age-related osteoporosis without current pathological fracture: Secondary | ICD-10-CM | POA: Diagnosis not present

## 2015-05-11 NOTE — Telephone Encounter (Signed)
Left on voicemail the below, will forward to pam

## 2015-05-11 NOTE — Telephone Encounter (Signed)
Okay to retry the Reclast. She may want to try taking Tylenol before the injection. [facility]  Note from Dr Audie BoxFontaine, Patient will have lab work . After we receive results will check insurance benefits. Left message regarding the above on pt voicemail.

## 2015-05-11 NOTE — Telephone Encounter (Signed)
Spoke with patient regarding the below and she will make lab appointment, but does want to try the reclast again. States she will deal with the flu-like symptoms, but does want to at least try this again. Please advise

## 2015-05-11 NOTE — Telephone Encounter (Signed)
Okay to retry the Reclast. She may want to try taking Tylenol before the injection. [facility].

## 2015-05-11 NOTE — Telephone Encounter (Signed)
Tell patient that her DEXA continues to show osteoporosis but appears to be stable from her Dexa's before essentially the same as her 2011 DEXA.  Her vitamin D was low when she should be taking extra vitamin D and rechecking the level in several months as well as staying on her estrogen I think both are beneficial as far as her bones. She's tried several medications and had side effects. One option would be to try something else or stay on her extra vitamin D recheck the level make sure it's adequate and then staying on her estrogen. I think at this point that would be my preference with a recheck of her bone density in 2 years.    My only other question is she is young to have osteoporosis and I cannot see where a secondary workup was done previously which would include a comprehensive metabolic panel, TSH, PTH and 24 hour urine collection for calcium excretion. I would recommend doing the studies now is at baseline also to make sure that we are not missing anything that is treatable.

## 2015-05-12 ENCOUNTER — Other Ambulatory Visit: Payer: BLUE CROSS/BLUE SHIELD

## 2015-05-12 DIAGNOSIS — M810 Age-related osteoporosis without current pathological fracture: Secondary | ICD-10-CM

## 2015-05-12 DIAGNOSIS — E559 Vitamin D deficiency, unspecified: Secondary | ICD-10-CM

## 2015-05-12 LAB — COMPREHENSIVE METABOLIC PANEL
ALBUMIN: 4 g/dL (ref 3.5–5.2)
ALT: 21 U/L (ref 0–35)
AST: 24 U/L (ref 0–37)
Alkaline Phosphatase: 65 U/L (ref 39–117)
BUN: 18 mg/dL (ref 6–23)
CHLORIDE: 101 meq/L (ref 96–112)
CO2: 27 mEq/L (ref 19–32)
Calcium: 9.1 mg/dL (ref 8.4–10.5)
Creat: 0.89 mg/dL (ref 0.50–1.10)
GLUCOSE: 78 mg/dL (ref 70–99)
Potassium: 4 mEq/L (ref 3.5–5.3)
SODIUM: 139 meq/L (ref 135–145)
Total Bilirubin: 0.5 mg/dL (ref 0.2–1.2)
Total Protein: 6.5 g/dL (ref 6.0–8.3)

## 2015-05-13 LAB — TSH: TSH: 3.891 u[IU]/mL (ref 0.350–4.500)

## 2015-05-13 LAB — VITAMIN D 25 HYDROXY (VIT D DEFICIENCY, FRACTURES): Vit D, 25-Hydroxy: 41 ng/mL (ref 30–100)

## 2015-05-15 ENCOUNTER — Other Ambulatory Visit: Payer: BLUE CROSS/BLUE SHIELD

## 2015-05-15 DIAGNOSIS — M810 Age-related osteoporosis without current pathological fracture: Secondary | ICD-10-CM

## 2015-05-15 LAB — PTH, INTACT AND CALCIUM
Calcium: 9.4 mg/dL (ref 8.4–10.5)
PTH: 33 pg/mL (ref 14–64)

## 2015-05-16 LAB — CALCIUM, URINE, 24 HOUR
CALCIUM 24HR UR: 111 mg/d (ref 100–250)
Calcium, Ur: 3 mg/dL

## 2015-05-16 LAB — CREATININE, URINE, 24 HOUR
Creatinine, 24H Ur: 1449 mg/d (ref 700–1800)
Creatinine, Urine: 39.2 mg/dL

## 2015-05-19 NOTE — Telephone Encounter (Signed)
I think it would be reasonable since her secondary workup is negative, vitamin D is now normal and she continues on hormone replacement therapy which is beneficial to the bones that we can wait on the Reclast repeat her bone density in 2 years and then make a decision at that point if there appears to be deterioration.

## 2015-05-19 NOTE — Telephone Encounter (Addendum)
Patient request that Dr Phineas Real advise her regarding Reclast. She was notified that all lab work and 24 hr urine are normal. She does understand to recheck her Vit D level and continue her estrogen and Vit d intake. She would like to know if she should wait regarding the Reclast infusion or schedule it next week.  Her insurance benefits reference # [id number]  No co-pay,  Subject to deductible  $700 ($333.45 met), OOP MAX ($4000), ($550.89 met). Co insurance 20% . Pt cost approx $750.00  05/12/15 BUN 18, CREATININE 0.89    CALCIUM 9.1

## 2015-05-23 NOTE — Telephone Encounter (Signed)
Phone call to pt with Dr Reynold Bowen recommendation about Reclast. She will continue her annual check up in  2017 and 2018 and follow his directions  Regarding repeat bone density.

## 2015-07-18 ENCOUNTER — Other Ambulatory Visit: Payer: Self-pay

## 2015-07-18 DIAGNOSIS — Z1231 Encounter for screening mammogram for malignant neoplasm of breast: Secondary | ICD-10-CM

## 2015-08-23 ENCOUNTER — Ambulatory Visit
Admission: RE | Admit: 2015-08-23 | Discharge: 2015-08-23 | Disposition: A | Discharge status: Home / Self Care | Payer: BLUE CROSS/BLUE SHIELD | Source: Ambulatory Visit

## 2015-08-23 DIAGNOSIS — Z1231 Encounter for screening mammogram for malignant neoplasm of breast: Secondary | ICD-10-CM

## 2015-09-19 ENCOUNTER — Other Ambulatory Visit: Payer: Self-pay | Admitting: Gynecology

## 2015-09-19 DIAGNOSIS — E559 Vitamin D deficiency, unspecified: Secondary | ICD-10-CM

## 2016-04-19 ENCOUNTER — Encounter: Payer: BLUE CROSS/BLUE SHIELD | Admitting: Gynecology

## 2016-04-24 ENCOUNTER — Encounter: Payer: BLUE CROSS/BLUE SHIELD | Admitting: Gynecology

## 2016-06-12 ENCOUNTER — Encounter: Payer: Self-pay | Admitting: Gynecology

## 2016-06-12 ENCOUNTER — Ambulatory Visit (INDEPENDENT_AMBULATORY_CARE_PROVIDER_SITE_OTHER): Payer: BLUE CROSS/BLUE SHIELD | Admitting: Gynecology

## 2016-06-12 VITALS — BP 118/80 | Ht 61.0 in | Wt 144.0 lb

## 2016-06-12 DIAGNOSIS — Z01419 Encounter for gynecological examination (general) (routine) without abnormal findings: Secondary | ICD-10-CM | POA: Diagnosis not present

## 2016-06-12 DIAGNOSIS — M810 Age-related osteoporosis without current pathological fracture: Secondary | ICD-10-CM | POA: Diagnosis not present

## 2016-06-12 DIAGNOSIS — Z79890 Hormone replacement therapy (postmenopausal): Secondary | ICD-10-CM | POA: Diagnosis not present

## 2016-06-12 DIAGNOSIS — N952 Postmenopausal atrophic vaginitis: Secondary | ICD-10-CM | POA: Diagnosis not present

## 2016-06-12 LAB — CBC WITH DIFFERENTIAL/PLATELET
BASOS ABS: 62 {cells}/uL (ref 0–200)
Basophils Relative: 1 %
EOS ABS: 434 {cells}/uL (ref 15–500)
Eosinophils Relative: 7 %
HCT: 36.6 % (ref 35.0–45.0)
HEMOGLOBIN: 11.2 g/dL — AB (ref 11.7–15.5)
LYMPHS ABS: 1798 {cells}/uL (ref 850–3900)
Lymphocytes Relative: 29 %
MCH: 23.5 pg — AB (ref 27.0–33.0)
MCHC: 30.6 g/dL — ABNORMAL LOW (ref 32.0–36.0)
MCV: 76.9 fL — AB (ref 80.0–100.0)
MONO ABS: 558 {cells}/uL (ref 200–950)
MPV: 9 fL (ref 7.5–12.5)
Monocytes Relative: 9 %
NEUTROS ABS: 3348 {cells}/uL (ref 1500–7800)
Neutrophils Relative %: 54 %
Platelets: 270 10*3/uL (ref 140–400)
RBC: 4.76 MIL/uL (ref 3.80–5.10)
RDW: 18.3 % — ABNORMAL HIGH (ref 11.0–15.0)
WBC: 6.2 10*3/uL (ref 3.8–10.8)

## 2016-06-12 LAB — URINALYSIS W MICROSCOPIC + REFLEX CULTURE
BACTERIA UA: NONE SEEN [HPF]
Bilirubin Urine: NEGATIVE
CASTS: NONE SEEN [LPF]
CRYSTALS: NONE SEEN [HPF]
Glucose, UA: NEGATIVE
HGB URINE DIPSTICK: NEGATIVE
Ketones, ur: NEGATIVE
Leukocytes, UA: NEGATIVE
Nitrite: NEGATIVE
PROTEIN: NEGATIVE
RBC / HPF: NONE SEEN RBC/HPF (ref <=2)
Specific Gravity, Urine: <1.005 (ref 1.001–1.035)
WBC, UA: NONE SEEN WBC/HPF (ref <=5)
YEAST: NONE SEEN [HPF]
pH: 6 (ref 5.0–8.0)

## 2016-06-12 LAB — HEMOGLOBIN A1C
Hgb A1c MFr Bld: 5.7 % — ABNORMAL HIGH (ref <5.7)
Mean Plasma Glucose: 117 mg/dL

## 2016-06-12 MED ORDER — ESTRADIOL 0.05 MG/24HR TD PTTW: MEDICATED_PATCH | TRANSDERMAL | Status: DC

## 2016-06-12 NOTE — Progress Notes (Signed)
    [name] 04/11/1961 [id number]        55 y.o.  [id number]  for annual exam.  [place] issues noted below.  Past medical history,surgical history, problem list, medications, allergies, family history and social history were all reviewed and documented as reviewed in the EPIC chart.  ROS:  Performed with pertinent positives and negatives included in the history, assessment and plan.   Additional significant findings :  None   Exam: [name] assistant Filed Vitals:   06/12/16 1438  BP: 118/80  Height: 5\' 1"  (1.549 m)  Weight: 144 lb (65.318 kg)   General appearance:  Normal affect, orientation and appearance. Skin: Grossly normal HEENT: Without gross lesions.  No cervical or supraclavicular adenopathy. Thyroid normal.  Lungs:  Clear without wheezing, rales or rhonchi Cardiac: RR, without RMG Abdominal:  Soft, nontender, without masses, guarding, rebound, organomegaly or hernia Breasts:  Examined lying and sitting without masses, retractions, discharge or axillary adenopathy. Pelvic:  Ext/BUS/vagina With mild atrophic changes  Adnexa without masses or tenderness    Anus and perineum normal   Rectovaginal normal sphincter tone without palpated masses or tenderness.    Assessment/Plan:  55 y.o. [id number] female for annual exam.   1. Postmenopausal/HRT. Status post TVH posterior repair for prolapse rectocele early 2000's. Subsequently's laparoscopic BSO for ovarian cysts. Currently on Vivelle 0.05 mg patch doing well. I reviewed the most recent 2017 [employer] HRT guidelines. I discussed the shared physician/patient decision process. Risks to include thrombosis such as stroke heart attack DVT. Breast cancer issues and possible benefits when started early to include cardiovascular and bone health reviewed. She does have a history of osteoporosis. After lengthy discussion she wants to continue and I refilled her 1 year. 2. Osteoporosis. DEXA 2016 T score -2.6. We discussed this per  05/11/2015 telephone entry. Secondary workup to include 24 urine calcium excretion all negative. Check vitamin D level today. Plan repeat DEXA next year. At this point she declines treatment. She had received one course of Reclast and one course of Prolia with unacceptable side effects. HRT continues which should help with her bone health. She does relate a history of eating disorder when she was younger which probably contributed to her early-onset osteoporosis.   3. Pap smear 2016 normal. No Pap smear done today. No history of significant abnormal Pap smears previously. 4.  Mammography 08/2015. Continue with annual mammography when due. SBE monthly reviewed. 5. Colonoscopy 2009 with reported repeat interval 10 years. 6. Health maintenance. Patient asked to repeat a hemoglobin A1c and CBC. Had this done at Dr. Isac [name]'s office and apparently they had marginal results and she asked for a repeat. Also check her vitamin D and urinalysis today. History of Eating disorder. She is actually currently seeing Dr. Kevan [name] for this. Also is going to make an appointment with [facility]. Follow up here in one year, sooner as needed.    [name] LordsFONTAINE,Brienna [name] P MD, 3:00 PM 06/12/2016

## 2016-06-12 NOTE — Patient Instructions (Signed)

## 2016-06-13 ENCOUNTER — Telehealth: Payer: Self-pay | Admitting: *Deleted

## 2016-06-13 ENCOUNTER — Other Ambulatory Visit: Payer: Self-pay | Admitting: Gynecology

## 2016-06-13 DIAGNOSIS — E559 Vitamin D deficiency, unspecified: Secondary | ICD-10-CM

## 2016-06-13 DIAGNOSIS — D649 Anemia, unspecified: Secondary | ICD-10-CM

## 2016-06-13 LAB — VITAMIN D 25 HYDROXY (VIT D DEFICIENCY, FRACTURES): Vit D, 25-Hydroxy: 27 ng/mL — ABNORMAL LOW (ref 30–100)

## 2016-06-13 NOTE — Telephone Encounter (Signed)
Referral placed at  they will contact pt to schedule. 

## 2016-06-13 NOTE — Telephone Encounter (Signed)
-----   Message from [name] BachelorKatherine R [name], [place] sent at 06/13/2016  4:18 PM EDT ----- Regarding: GI referral Per Dr. Velvet BatheF "Her hemoglobin is low at 11. This is not normal in a woman who is not menstruating. Need to be evaluated for other sources of blood loss like GI tract. Recommend obtaining copies of all of her lab values from the MyChart and following up with Dr. Kevan NyGates who has been following her for these issues. I would recommend a GI consult with possible endoscopy/colonoscopy to rule out GI source for blood loss."  Patient would like us to go ahead and give her referral to GI MD for consult.  She is going to talk with Dr. Kevan NyGates too. She knows it will be next week before you can work on it.  Thanks!!!

## 2016-06-14 ENCOUNTER — Encounter: Payer: Self-pay | Admitting: Gastroenterology

## 2016-06-17 NOTE — Telephone Encounter (Signed)
Appointment 08/16/16  With Dr.Jacobs

## 2016-06-18 ENCOUNTER — Telehealth: Payer: Self-pay | Admitting: *Deleted

## 2016-06-18 DIAGNOSIS — Z833 Family history of diabetes mellitus: Secondary | ICD-10-CM

## 2016-06-18 DIAGNOSIS — Z8659 Personal history of other mental and behavioral disorders: Secondary | ICD-10-CM

## 2016-06-18 NOTE — Telephone Encounter (Signed)
Pt called and left message in triage voicemail that a referral will need to be placed for [facility], referral they will contact pt to schedule.

## 2016-07-11 ENCOUNTER — Ambulatory Visit: Payer: BLUE CROSS/BLUE SHIELD | Admitting: *Deleted

## 2016-07-12 NOTE — Telephone Encounter (Signed)
Pt called to cancel referral with nutrition. They closed referral

## 2016-07-31 ENCOUNTER — Other Ambulatory Visit: Payer: Self-pay | Admitting: Gynecology

## 2016-07-31 DIAGNOSIS — Z1231 Encounter for screening mammogram for malignant neoplasm of breast: Secondary | ICD-10-CM

## 2016-08-16 ENCOUNTER — Ambulatory Visit: Payer: BLUE CROSS/BLUE SHIELD | Admitting: Gastroenterology

## 2016-08-23 ENCOUNTER — Ambulatory Visit
Admission: RE | Admit: 2016-08-23 | Discharge: 2016-08-23 | Disposition: A | Discharge status: Home / Self Care | Payer: BLUE CROSS/BLUE SHIELD | Source: Ambulatory Visit | Attending: Gynecology | Admitting: Gynecology

## 2016-08-23 DIAGNOSIS — Z1231 Encounter for screening mammogram for malignant neoplasm of breast: Secondary | ICD-10-CM

## 2016-09-06 ENCOUNTER — Other Ambulatory Visit: Payer: Self-pay | Admitting: Internal Medicine

## 2016-09-06 DIAGNOSIS — D509 Iron deficiency anemia, unspecified: Secondary | ICD-10-CM

## 2016-09-12 ENCOUNTER — Other Ambulatory Visit: Payer: BLUE CROSS/BLUE SHIELD

## 2016-09-13 ENCOUNTER — Other Ambulatory Visit: Payer: Self-pay

## 2016-09-17 ENCOUNTER — Other Ambulatory Visit: Payer: Self-pay | Admitting: Internal Medicine

## 2016-09-17 ENCOUNTER — Ambulatory Visit
Admission: RE | Admit: 2016-09-17 | Discharge: 2016-09-17 | Disposition: A | Discharge status: Home / Self Care | Payer: BLUE CROSS/BLUE SHIELD | Source: Ambulatory Visit | Attending: Internal Medicine | Admitting: Internal Medicine

## 2016-09-17 DIAGNOSIS — D509 Iron deficiency anemia, unspecified: Secondary | ICD-10-CM

## 2016-10-23 ENCOUNTER — Ambulatory Visit: Payer: BLUE CROSS/BLUE SHIELD | Admitting: Gastroenterology

## 2016-11-03 ENCOUNTER — Ambulatory Visit (HOSPITAL_COMMUNITY)
Admission: EM | Admit: 2016-11-03 | Discharge: 2016-11-03 | Disposition: A | Discharge status: Home / Self Care | Payer: BLUE CROSS/BLUE SHIELD | Attending: Family Medicine | Admitting: Family Medicine

## 2016-11-03 ENCOUNTER — Encounter (HOSPITAL_COMMUNITY): Payer: Self-pay | Admitting: *Deleted

## 2016-11-03 DIAGNOSIS — T23262A Burn of second degree of back of left hand, initial encounter: Secondary | ICD-10-CM

## 2016-11-03 MED ORDER — TRAMADOL HCL 50 MG PO TABS: 50.0000 mg | ORAL_TABLET | Freq: Four times a day (QID) | ORAL | 0 refills | Status: DC | PRN

## 2016-11-03 MED ORDER — SILVER SULFADIAZINE 1 % EX CREA: 1.0000 "application " | TOPICAL_CREAM | Freq: Three times a day (TID) | CUTANEOUS | 0 refills | Status: DC

## 2016-11-03 MED ORDER — KETOROLAC TROMETHAMINE 30 MG/ML IJ SOLN
INTRAMUSCULAR | Status: AC
  Filled 2016-11-03: qty 1

## 2016-11-03 MED ORDER — TRAMADOL HCL 50 MG PO TABS: 50.0000 mg | ORAL_TABLET | Freq: Four times a day (QID) | ORAL | Status: DC

## 2016-11-03 MED ORDER — KETOROLAC TROMETHAMINE 30 MG/ML IJ SOLN
30.0000 mg | Freq: Once | INTRAMUSCULAR | Status: AC
  Administered 2016-11-03: 30 mg via INTRAMUSCULAR

## 2016-11-03 NOTE — Discharge Instructions (Signed)
See your doctor or return on mon for recheck of burn. Leave bandaged until seen.

## 2016-11-03 NOTE — ED Provider Notes (Signed)
[facility]    CSN: [id number] Arrival date & time: 11/03/16  1758     History   Chief Complaint Chief Complaint  Patient presents with  . Burn    HPI [name] is a 55 y.o. female.   The history is provided by the patient.  Burn  Burn location:  Hand Hand burn location:  [place] of L hand Burn quality:  Painful and red Time since incident:  1 hour Progression:  Unchanged (tripped over hubands foot in kitchen and side of hand struck stove. no palmar or finger involvement.) Pain details:    Severity:  Moderate   Progression:  Unchanged Mechanism of burn:  Hot surface Incident location:  Kitchen Relieved by:  Cold compresses Tetanus status:  Up to date   Past Medical History:  Diagnosis Date  . Anxiety   . Osteoporosis 2016   T score -2.6 DEXA 2011 with T score -2.6    Patient Active Problem List   Diagnosis Date Noted  . GERD (gastroesophageal reflux disease) 08/24/2013  . Acute bronchitis 08/24/2013  . Osteoporosis   . Unspecified vitamin D deficiency   . ALLERGIC CONJUNCTIVITIS 05/05/2008  . NASAL POLYP 05/05/2008  . Seasonal allergic rhinitis 05/05/2008  . Pneumothorax 05/05/2008  . BREAST MASS, BENIGN 05/05/2008    Past Surgical History:  Procedure Laterality Date  . APPENDECTOMY    . bladder distention  2009  . BREAST LUMPECTOMY     Benign-Bilateral  . KNEE SURGERY  04,05  . KNEE SURGERY  2016  . OOPHORECTOMY     laparoscopic BSO for benign cysts  . REFRACTIVE SURGERY  2016  . REPAIR OF RECTOCELE    . VAGINAL HYSTERECTOMY     early 2000's for uterine prolapse and rectocele    OB History    Gravida Para Term Preterm AB Living   3 2 2   1 2    SAB TAB Ectopic Multiple Live Births                   Home Medications    Prior to Admission medications   Medication Sig Start Date End Date Taking? Authorizing Provider  LORazepam (ATIVAN PO) Take by mouth.   Yes Historical Provider, MD  ALPRAZolam (XANAX PO) Take 0.5 mg by  mouth. Prn     Historical Provider, MD  estradiol (VIVELLE-DOT) 0.05 MG/24HR patch Place one patch twice a week. 06/12/16   Dara Lordsimothy P Fontaine, MD  fluvoxaMINE (LUVOX) 100 MG tablet as directed. 04/08/16   Historical Provider, MD  meloxicam (MOBIC) 15 MG tablet as needed. 05/28/16   Historical Provider, MD  pramipexole (MIRAPEX) 1.5 MG tablet Take 1 tablet by mouth daily. 04/08/16   Historical Provider, MD  RABEprazole (ACIPHEX) 20 MG tablet Take 20 mg by mouth daily.    Historical Provider, MD  topiramate (TOPAMAX) 25 MG tablet Take 1 tablet by mouth daily. 05/28/16   Historical Provider, MD    Family History Family History  Problem Relation Age of Onset  . Hypertension Mother   . Diabetes Father   . Hypertension Father   . Heart disease Father   . Hypertension Sister   . Heart disease Paternal Uncle   . Diabetes Paternal Grandmother   . Heart disease Paternal Grandmother   . Diabetes Maternal Grandmother   . Heart disease Maternal Grandmother   . Diabetes Maternal Grandfather   . Heart disease Maternal Grandfather   . Diabetes Paternal Grandfather   . Heart disease  Paternal Grandfather     Social History Social History  Substance Use Topics  . Smoking status: Never Smoker  . Smokeless tobacco: Never Used  . Alcohol use 0.0 oz/week     Comment: rare     Allergies   Cephalexin; Codeine; Decongestant [pseudoephedrine hcl er]; Erythromycin; Neomycin-bacitracin zn-polymyx; Petrol dist-piperonyl butoxide-pyrethrins [pyrethrins-piperonyl butoxide]; Promethazine hcl; Restasis [cyclosporine]; and Sertraline hcl   Review of Systems Review of Systems  Constitutional: Negative.   Skin: Positive for color change and wound.     Physical Exam Triage Vital Signs ED Triage Vitals [11/03/16 1817]  Enc Vitals Group     BP 102/61     Pulse Rate 78     Resp 16     Temp 98.1 F (36.7 C)     Temp Source Oral     SpO2 100 %     Weight      Height      Head Circumference      Peak  Flow      Pain Score 10     Pain Loc      Pain Edu?      Excl. in GC?    No data found.   Updated Vital Signs BP 102/61 (BP Location: Left Arm)   Pulse 78   Temp 98.1 F (36.7 C) (Oral)   Resp 16   LMP 12/09/1996   SpO2 100%   Visual Acuity Right Eye Distance:   Left Eye Distance:   Bilateral Distance:    Right Eye Near:   Left Eye Near:    Bilateral Near:     Physical Exam  Constitutional: She is oriented to person, place, and time. She appears well-developed and well-nourished.  Musculoskeletal: She exhibits tenderness.       Hands: Neurological: She is alert and oriented to person, place, and time.  Skin: There is erythema.  Nursing note and vitals reviewed.    UC Treatments / Results  Labs (all labs ordered are listed, but only abnormal results are displayed) Labs Reviewed - No data to display  EKG  EKG Interpretation None       Radiology No results found.  Procedures Procedures (including critical care time)  Medications Ordered in UC Medications  traMADol (ULTRAM) tablet 50 mg (not administered)     Initial Impression / Assessment and Plan / UC Course  I have reviewed the triage vital signs and the nursing notes.  Pertinent labs & imaging results that were available during my care of the patient were reviewed by me and considered in my medical decision making (see chart for details).  Clinical Course       Final Clinical Impressions(s) / UC Diagnoses   Final diagnoses:  None    New Prescriptions New Prescriptions   No medications on file     Linna HoffJames D Shereta Crothers, MD 11/03/16 2044

## 2016-11-03 NOTE — ED Notes (Signed)
Silvadene      With   dsd     Applied  To  The  burn

## 2016-11-03 NOTE — ED Triage Notes (Signed)
Thermal   Burn  To  l   Hand   approx  1 hr  Ago           Pain  Scale   10

## 2016-11-06 ENCOUNTER — Encounter (HOSPITAL_COMMUNITY): Payer: Self-pay | Admitting: Emergency Medicine

## 2016-11-06 ENCOUNTER — Ambulatory Visit (HOSPITAL_COMMUNITY)
Admission: EM | Admit: 2016-11-06 | Discharge: 2016-11-06 | Disposition: A | Discharge status: Home / Self Care | Payer: BLUE CROSS/BLUE SHIELD | Attending: Internal Medicine | Admitting: Internal Medicine

## 2016-11-06 DIAGNOSIS — S63501A Unspecified sprain of right wrist, initial encounter: Secondary | ICD-10-CM | POA: Diagnosis not present

## 2016-11-06 MED ORDER — NAPROXEN 500 MG PO TABS: 500.0000 mg | ORAL_TABLET | Freq: Two times a day (BID) | ORAL | 0 refills | Status: DC

## 2016-11-06 NOTE — ED Notes (Signed)
The previously assessed burn on the patient's left forearm was redressed with silvadene cream, nonadherent gauze and coban wrap per the provider's verbal orders.

## 2016-11-06 NOTE — ED Triage Notes (Signed)
Patient has been seen recently for left wrist burn.  Since then has been doing a lot of lifting and using her right hand.  Right wrist pain started yesterday .   Able to move fingers, cap refill 2+.  No known injury.  Radial pulse 2 +  Patient also requesting a dressing change to burn on left wrist.

## 2016-11-07 NOTE — ED Provider Notes (Signed)
CSN: [id number]     Arrival date & time 11/06/16  1337 History   None    Chief Complaint  Patient presents with  . Wrist Pain   (Consider location/radiation/quality/duration/timing/severity/associated sxs/prior Treatment) Patient c/o wrist pain due to overuse at work.  She also has a burn on her left wrist and needs a dressing change.   The history is provided by the patient.  Wrist Pain  This is a new problem. The current episode started 2 days ago. The problem occurs constantly. The problem has not changed since onset.Nothing aggravates the symptoms. Nothing relieves the symptoms.    Past Medical History:  Diagnosis Date  . Anxiety   . Osteoporosis 2016   T score -2.6 DEXA 2011 with T score -2.6   Past Surgical History:  Procedure Laterality Date  . APPENDECTOMY    . bladder distention  2009  . BREAST LUMPECTOMY     Benign-Bilateral  . KNEE SURGERY  04,05  . KNEE SURGERY  2016  . OOPHORECTOMY     laparoscopic BSO for benign cysts  . REFRACTIVE SURGERY  2016  . REPAIR OF RECTOCELE    . VAGINAL HYSTERECTOMY     early 2000's for uterine prolapse and rectocele   Family History  Problem Relation Age of Onset  . Hypertension Mother   . Diabetes Father   . Hypertension Father   . Heart disease Father   . Hypertension Sister   . Heart disease Paternal Uncle   . Diabetes Paternal Grandmother   . Heart disease Paternal Grandmother   . Diabetes Maternal Grandmother   . Heart disease Maternal Grandmother   . Diabetes Maternal Grandfather   . Heart disease Maternal Grandfather   . Diabetes Paternal Grandfather   . Heart disease Paternal Grandfather    Social History  Substance Use Topics  . Smoking status: Never Smoker  . Smokeless tobacco: Never Used  . Alcohol use 0.0 oz/week     Comment: rare   OB History    Gravida Para Term Preterm AB Living   3 2 2   1 2    SAB TAB Ectopic Multiple Live Births                 Review of Systems  Constitutional: Negative.    HENT: Negative.   Eyes: Negative.   Respiratory: Negative.   Cardiovascular: Negative.   Gastrointestinal: Negative.   Endocrine: Negative.   Genitourinary: Negative.   Musculoskeletal: Positive for arthralgias.  Skin: Positive for wound.  Allergic/Immunologic: Negative.   Neurological: Negative.   Hematological: Negative.   Psychiatric/Behavioral: Negative.     Allergies  Cephalexin; Codeine; Decongestant [pseudoephedrine hcl er]; Erythromycin; Neomycin-bacitracin zn-polymyx; Petrol dist-piperonyl butoxide-pyrethrins [pyrethrins-piperonyl butoxide]; Promethazine hcl; Restasis [cyclosporine]; and Sertraline hcl  Home Medications   Prior to Admission medications   Medication Sig Start Date End Date Taking? Authorizing Provider  ALPRAZolam (XANAX PO) Take 0.5 mg by mouth. Prn     Historical Provider, MD  estradiol (VIVELLE-DOT) 0.05 MG/24HR patch Place one patch twice a week. 06/12/16   Dara Lordsimothy P Fontaine, MD  fluvoxaMINE (LUVOX) 100 MG tablet as directed. 04/08/16   Historical Provider, MD  LORazepam (ATIVAN PO) Take by mouth.    Historical Provider, MD  meloxicam (MOBIC) 15 MG tablet as needed. 05/28/16   Historical Provider, MD  naproxen (NAPROSYN) 500 MG tablet Take 1 tablet (500 mg total) by mouth 2 (two) times daily with a meal. 11/06/16   Deatra CanterWilliam J Tushar Enns, FNP  pramipexole (  MIRAPEX) 1.5 MG tablet Take 1 tablet by mouth daily. 04/08/16   Historical Provider, MD  RABEprazole (ACIPHEX) 20 MG tablet Take 20 mg by mouth daily.    Historical Provider, MD  silver sulfADIAZINE (SILVADENE) 1 % cream Apply 1 application topically 3 (three) times daily. 11/03/16   Linna HoffJames D Kindl, MD  topiramate (TOPAMAX) 25 MG tablet Take 1 tablet by mouth daily. 05/28/16   Historical Provider, MD  traMADol (ULTRAM) 50 MG tablet Take 1 tablet (50 mg total) by mouth every 6 (six) hours as needed. 11/03/16   Linna HoffJames D Kindl, MD   Meds Ordered and Administered this Visit  Medications - No data to display  BP  99/61 (BP Location: Left Arm)   Pulse 80   Temp 97.5 F (36.4 C) (Oral)   Resp 22   LMP 12/09/1996   SpO2 100%  No data found.   Physical Exam  Constitutional: She appears well-developed and well-nourished.  HENT:  Head: Normocephalic and atraumatic.  Eyes: Pupils are equal, round, and reactive to light.  Neck: Normal range of motion. Neck supple.  Cardiovascular: Normal rate, regular rhythm and normal heart sounds.   Pulmonary/Chest: Effort normal and breath sounds normal.  Abdominal: Soft. Bowel sounds are normal.  Musculoskeletal: She exhibits tenderness.  TTP right wrist and positive Tinnels and phalens  Skin:  Left wrist lateral distal wrist with erythema and blister that has ruptured.  Nursing note and vitals reviewed.   Urgent Care Course   Clinical Course     Procedures (including critical care time)  Labs Review Labs Reviewed - No data to display  Imaging Review No results found.   Visual Acuity Review  Right Eye Distance:   Left Eye Distance:   Bilateral Distance:    Right Eye Near:   Left Eye Near:    Bilateral Near:         MDM   1. Sprain of right wrist, initial encounter    Right wrist splint Naprosyn 500mg  one po bid x 10 days #20  2. Burn Left wrist - This is follow up and she has well healing 2nd degree burn and silvadene applied and dressing applied.  Follow up with PCP.   Deatra CanterWilliam J Jhada Risk, FNP 11/07/16 [phone]

## 2017-06-13 ENCOUNTER — Encounter: Payer: BLUE CROSS/BLUE SHIELD | Admitting: Gynecology

## 2017-06-18 ENCOUNTER — Encounter: Payer: Self-pay | Admitting: Gynecology

## 2017-06-18 ENCOUNTER — Ambulatory Visit (INDEPENDENT_AMBULATORY_CARE_PROVIDER_SITE_OTHER): Payer: 59 | Admitting: Gynecology

## 2017-06-18 VITALS — BP 124/74 | Ht 61.0 in | Wt 162.0 lb

## 2017-06-18 DIAGNOSIS — M810 Age-related osteoporosis without current pathological fracture: Secondary | ICD-10-CM

## 2017-06-18 DIAGNOSIS — Z79890 Hormone replacement therapy (postmenopausal): Secondary | ICD-10-CM

## 2017-06-18 DIAGNOSIS — R635 Abnormal weight gain: Secondary | ICD-10-CM

## 2017-06-18 DIAGNOSIS — Z01411 Encounter for gynecological examination (general) (routine) with abnormal findings: Secondary | ICD-10-CM

## 2017-06-18 DIAGNOSIS — N952 Postmenopausal atrophic vaginitis: Secondary | ICD-10-CM

## 2017-06-18 LAB — CBC WITH DIFFERENTIAL/PLATELET
BASOS ABS: 71 {cells}/uL (ref 0–200)
Basophils Relative: 1 %
EOS ABS: 639 {cells}/uL — AB (ref 15–500)
Eosinophils Relative: 9 %
HEMATOCRIT: 46.9 % — AB (ref 35.0–45.0)
HEMOGLOBIN: 15.1 g/dL (ref 11.7–15.5)
LYMPHS ABS: 2130 {cells}/uL (ref 850–3900)
Lymphocytes Relative: 30 %
MCH: 29.2 pg (ref 27.0–33.0)
MCHC: 32.2 g/dL (ref 32.0–36.0)
MCV: 90.5 fL (ref 80.0–100.0)
MONO ABS: 426 {cells}/uL (ref 200–950)
MPV: 9.3 fL (ref 7.5–12.5)
Monocytes Relative: 6 %
NEUTROS ABS: 3834 {cells}/uL (ref 1500–7800)
NEUTROS PCT: 54 %
Platelets: 305 10*3/uL (ref 140–400)
RBC: 5.18 MIL/uL — ABNORMAL HIGH (ref 3.80–5.10)
RDW: 13 % (ref 11.0–15.0)
WBC: 7.1 10*3/uL (ref 3.8–10.8)

## 2017-06-18 LAB — GLUCOSE, RANDOM: Glucose, Bld: 83 mg/dL (ref 65–99)

## 2017-06-18 MED ORDER — ESTRADIOL 0.05 MG/24HR TD PTTW: MEDICATED_PATCH | TRANSDERMAL | 4 refills | Status: DC

## 2017-06-18 NOTE — Patient Instructions (Signed)
Followup for bone density as scheduled. 

## 2017-06-18 NOTE — Progress Notes (Signed)
    [name] 03/07/1961 [id number]        56 y.o.  [id number] for annual exam.    Past medical history,surgical history, problem list, medications, allergies, family history and social history were all reviewed and documented as reviewed in the EPIC chart.  ROS:  Performed with pertinent positives and negatives included in the history, assessment and plan.   Additional significant findings :  None   Exam: Kennon PortelaKim Gardner assistant Vitals:   06/18/17 1421  BP: 124/74  Weight: 162 lb (73.5 kg)  Height: 5\' 1"  (1.549 m)   Body mass index is 30.61 kg/m.  General appearance:  Normal affect, orientation and appearance. Skin: Grossly normal HEENT: Without gross lesions.  No cervical or supraclavicular adenopathy. Thyroid normal.  Lungs:  Clear without wheezing, rales or rhonchi Cardiac: RR, without RMG Abdominal:  Soft, nontender, without masses, guarding, rebound, organomegaly or hernia Breasts:  Examined lying and sitting without masses, retractions, discharge or axillary adenopathy. Pelvic:  Ext, BUS, Vagina: With atrophic changes  Adnexa: Without masses or tenderness    Anus and perineum: Normal   Rectovaginal: Normal sphincter tone without palpated masses or tenderness.    Assessment/Plan:  56 y.o. [id number] female for annual exam.   1. Postmenopausal/HRT. Continues on Vivelle 0.05 mg patches. Status post TVH, posterior repair and subsequent laparoscopic BSO for ovarian cysts. Doing well and wants to continue. I reviewed the most current data on HRT.  I discussed the risks to include stroke heart attack DVT and possible breast cancer. Benefits as far as symptom relief and possible cardiovascular and bone health also reviewed. Patient understands and accepts the risks and wants to continue. 2. Osteoporosis. DEXA 2016 T score -2.6. Secondary workup negative. Had eating disorder when she was young which may contribute to this. Also had marginal vitamin D level. She received 1 course of  Reclast and one course of Prolia with unacceptable side effects. She does continue on HRT now. Recheck bone density now and she will schedule. Also will check her vitamin D level today. 3. Pap smear 2016 normal. No Pap smear done today. No history of significant abnormal Pap smears. Options to stop screening altogether per current screening guidelines based on hysterectomy history reviewed. Will readdress on an annual basis. 4. Colonoscopy 2009. Plan repeat colonoscopy next year at 10 year interval. 5. Mammography 08/2016. Continue with annual mammography when due. SBE monthly reviewed. Breast exam normal today. 6. Health maintenance. Patient does relate weight gain and plans appointment with dietitian. Asked if I would check her glucose and hemoglobin A1c which has been marginal in the past. Otherwise she obtains her routine blood work through her primary physician's office. Follow up for bone density. Follow up in one year for annual exam.   Dara LordsFONTAINE,Lional Icenogle P MD, 2:46 PM 06/18/2017

## 2017-06-19 LAB — HEMOGLOBIN A1C
Hgb A1c MFr Bld: 5.5 % (ref <5.7)
Mean Plasma Glucose: 111 mg/dL

## 2017-06-19 LAB — VITAMIN D 25 HYDROXY (VIT D DEFICIENCY, FRACTURES): VIT D 25 HYDROXY: 25 ng/mL — AB (ref 30–100)

## 2017-06-23 ENCOUNTER — Other Ambulatory Visit: Payer: Self-pay | Admitting: *Deleted

## 2017-06-23 DIAGNOSIS — E559 Vitamin D deficiency, unspecified: Secondary | ICD-10-CM

## 2017-07-22 ENCOUNTER — Other Ambulatory Visit: Payer: Self-pay | Admitting: Internal Medicine

## 2017-07-22 DIAGNOSIS — Z1231 Encounter for screening mammogram for malignant neoplasm of breast: Secondary | ICD-10-CM

## 2017-07-24 ENCOUNTER — Ambulatory Visit (INDEPENDENT_AMBULATORY_CARE_PROVIDER_SITE_OTHER): Payer: 59 | Admitting: Adult Health

## 2017-07-24 ENCOUNTER — Telehealth: Payer: Self-pay | Admitting: Internal Medicine

## 2017-07-24 ENCOUNTER — Ambulatory Visit (INDEPENDENT_AMBULATORY_CARE_PROVIDER_SITE_OTHER)
Admission: RE | Admit: 2017-07-24 | Discharge: 2017-07-24 | Disposition: A | Discharge status: Home / Self Care | Payer: 59 | Source: Ambulatory Visit | Attending: Adult Health | Admitting: Adult Health

## 2017-07-24 ENCOUNTER — Encounter: Payer: Self-pay | Admitting: Adult Health

## 2017-07-24 DIAGNOSIS — J301 Allergic rhinitis due to pollen: Secondary | ICD-10-CM

## 2017-07-24 DIAGNOSIS — J209 Acute bronchitis, unspecified: Secondary | ICD-10-CM

## 2017-07-24 MED ORDER — HYDROCODONE-HOMATROPINE 5-1.5 MG/5ML PO SYRP: 5.0000 mL | ORAL_SOLUTION | Freq: Four times a day (QID) | ORAL | 0 refills | Status: DC | PRN

## 2017-07-24 MED ORDER — LEVALBUTEROL HCL 0.63 MG/3ML IN NEBU
0.6300 mg | INHALATION_SOLUTION | Freq: Once | RESPIRATORY_TRACT | Status: AC
  Administered 2017-07-24: 0.63 mg via RESPIRATORY_TRACT

## 2017-07-24 MED ORDER — PREDNISONE 10 MG PO TABS: ORAL_TABLET | ORAL | 0 refills | Status: DC

## 2017-07-24 MED ORDER — DOXYCYCLINE HYCLATE 100 MG PO TABS: 100.0000 mg | ORAL_TABLET | Freq: Two times a day (BID) | ORAL | 0 refills | Status: DC

## 2017-07-24 NOTE — Assessment & Plan Note (Signed)
Slow to resolve asthmatic bronchitis. Xopenex nebulizer, given the office. Chest x-ray today  Plan  Patient Instructions  Extend doxycycline for 3 days, take with food Mucinex DM twice daily as needed for cough congestion Prednisone taper. Chest x-ray today Hydromet 1 teaspoon every 6 hours as needed for cough, may make you sleepy Follow with Dr. Maple HudsonYoung in 2-3 months and as needed Please contact office for sooner follow up if symptoms do not improve or worsen or seek emergency care

## 2017-07-24 NOTE — Assessment & Plan Note (Signed)
Flare   Plan  Patient Instructions  Extend doxycycline for 3 days, take with food Mucinex DM twice daily as needed for cough congestion Prednisone taper. Chest x-ray today Hydromet 1 teaspoon every 6 hours as needed for cough, may make you sleepy Follow with Dr. Maple HudsonYoung in 2-3 months and as needed Please contact office for sooner follow up if symptoms do not improve or worsen or seek emergency care

## 2017-07-24 NOTE — Progress Notes (Signed)
 @Patient  ID: [name], female    DOB: 05/21/1961, 56 y.o.   MRN: [id number]  Chief Complaint  Patient presents with  . Acute Visit    cough     Referring provider: Marden NobleGates, Robert, MD  HPI: 56 year old female never smoker followed for allergic rhinitis Previously on allergy vaccines  07/24/2017 Acute OV : Cough  Pt presents for an acute office visit. Complains of 1 week of cough , congestion , thick mucus , wheezing and dyspnea. She was seen at urgent care _"tele doc" , called in Doxycycline and ProAir . Previous on BREO for Asthma per PCP but ran out.  On day 5/7 of Doxycycline.  [place] . She denies any chest pain, hemoptysis, calf pain, orthopnea, PND or leg swelling.  Has started on Luvox for OCD . Has caused wt gain (>40lbs)   Last seen in office 2014.   Allergies  Allergen Reactions  . Cephalexin   . Decongestant [Pseudoephedrine Hcl Er]     Heart races  . Erythromycin   . Neomycin-Bacitracin Zn-Polymyx     REACTION: RASH  . Petrol Dist-Piperonyl Butoxide-Pyrethrins [Pyrethrins-Piperonyl Butoxide]   . Promethazine Hcl   . Restasis [Cyclosporine]   . Sertraline Hcl   . Codeine Other (See Comments)    Makes pt agitated.    Immunization History  Administered Date(s) Administered  . Influenza-Unspecified 08/09/2016  . Pneumococcal-Unspecified 08/09/2016    Past Medical History:  Diagnosis Date  . Anxiety   . Osteoporosis 2016   T score -2.6 DEXA 2011 with T score -2.6  . Plantar fasciitis     Tobacco History: History  Smoking Status  . Never Smoker  Smokeless Tobacco  . Never Used   Counseling given: Not Answered   Outpatient Encounter Prescriptions as of 07/24/2017  Medication Sig  . doxycycline (VIBRA-TABS) 100 MG tablet Take 100 mg by mouth 2 (two) times daily.  Marland Kitchen. estradiol (VIVELLE-DOT) 0.05 MG/24HR patch Place one patch twice a week.  . fluticasone furoate-vilanterol (BREO ELLIPTA) 100-25 MCG/INH AEPB Inhale 1 puff into the  lungs daily.  . fluvoxaMINE (LUVOX) 100 MG tablet as directed.  . naproxen sodium (ALEVE) 220 MG tablet Take 220 mg by mouth 2 (two) times daily with a meal. Pt takes 2 tabs twice daily.  . pramipexole (MIRAPEX) 1.5 MG tablet Take 1 tablet by mouth daily.  Marland Kitchen. PROAIR HFA 108 (90 Base) MCG/ACT inhaler INL 2 PFS PO QID  . RABEprazole (ACIPHEX) 20 MG tablet Take 20 mg by mouth daily.  Marland Kitchen. doxycycline (VIBRA-TABS) 100 MG tablet Take 1 tablet (100 mg total) by mouth 2 (two) times daily.  Marland Kitchen. HYDROcodone-homatropine (HYDROMET) 5-1.5 MG/5ML syrup Take 5 mLs by mouth every 6 (six) hours as needed.  . predniSONE (DELTASONE) 10 MG tablet 4 tabs for 2 days, then 3 tabs for 2 days, 2 tabs for 2 days, then 1 tab for 2 days, then stop  . [DISCONTINUED] ALPRAZolam (XANAX PO) Take 0.5 mg by mouth. Prn   . [DISCONTINUED] LORazepam (ATIVAN PO) Take by mouth.  . [DISCONTINUED] meloxicam (MOBIC) 15 MG tablet as needed.  . [DISCONTINUED] naproxen (NAPROSYN) 500 MG tablet Take 1 tablet (500 mg total) by mouth 2 (two) times daily with a meal. (Patient not taking: Reported on 06/18/2017)  . [DISCONTINUED] silver sulfADIAZINE (SILVADENE) 1 % cream Apply 1 application topically 3 (three) times daily. (Patient not taking: Reported on 06/18/2017)   No facility-administered encounter medications on file as of 07/24/2017.  Review of Systems  Constitutional:   No  weight loss, night sweats,  Fevers, chills, fatigue, or  lassitude.  HEENT:   No headaches,  Difficulty swallowing,  Tooth/dental problems, or  Sore throat,                No sneezing, itching, ear ache, + nasal congestion, post nasal drip,   CV:  No chest pain,  Orthopnea, PND, swelling in lower extremities, anasarca, dizziness, palpitations, syncope.   GI  No heartburn, indigestion, abdominal pain, nausea, vomiting, diarrhea, change in bowel habits, loss of appetite, bloody stools.   Resp: .  No chest wall deformity  Skin: no rash or lesions.  GU: no  dysuria, change in color of urine, no urgency or frequency.  No flank pain, no hematuria   MS:  No joint pain or swelling.  No decreased range of motion.  No back pain.    Physical Exam  BP 112/68 (BP Location: Right Arm, Patient Position: Sitting, Cuff Size: Normal)   Pulse 82   Ht 5\' 1"  (1.549 m)   Wt 168 lb 12.8 oz (76.6 kg)   LMP 12/09/1996   SpO2 98%   BMI 31.89 kg/m   GEN: A/Ox3; pleasant , NAD, well nourished    HEENT:  Fellows/AT,  EACs-clear, TMs-wnl, NOSE-clear, THROAT-clear, no lesions, no postnasal drip or exudate noted.   NECK:  Supple w/ fair ROM; no JVD; normal carotid impulses w/o bruits; no thyromegaly or nodules palpated; no lymphadenopathy.    RESP  few trace wheezes . no accessory muscle use, no dullness to percussion Speaks in full sentences  CARD:  RRR, no m/r/g, no peripheral edema, pulses intact, no cyanosis or clubbing.  GI:   Soft & nt; nml bowel sounds; no organomegaly or masses detected.   Musco: Warm bil, no deformities or joint swelling noted.   Neuro: alert, no focal deficits noted.    Skin: Warm, no lesions or rashes    Lab Results:  CBC  BMET  BNP No results found for: BNP  ProBNP No results found for: PROBNP  Imaging: No results found.   Assessment & Plan:   Acute bronchitis Slow to resolve asthmatic bronchitis. Xopenex nebulizer, given the office. Chest x-ray today  Plan  Patient Instructions  Extend doxycycline for 3 days, take with food Mucinex DM twice daily as needed for cough congestion Prednisone taper. Chest x-ray today Hydromet 1 teaspoon every 6 hours as needed for cough, may make you sleepy Follow with Dr. Maple Hudson in 2-3 months and as needed Please contact office for sooner follow up if symptoms do not improve or worsen or seek emergency care      Seasonal allergic rhinitis Flare   Plan  Patient Instructions  Extend doxycycline for 3 days, take with food Mucinex DM twice daily as needed for cough  congestion Prednisone taper. Chest x-ray today Hydromet 1 teaspoon every 6 hours as needed for cough, may make you sleepy Follow with Dr. Maple Hudson in 2-3 months and as needed Please contact office for sooner follow up if symptoms do not improve or worsen or seek emergency care         [name] Oaks, NP 07/24/2017

## 2017-07-24 NOTE — Patient Instructions (Addendum)
Extend doxycycline for 3 days, take with food Mucinex DM twice daily as needed for cough congestion Prednisone taper. Chest x-ray today Hydromet 1 teaspoon every 6 hours as needed for cough, may make you sleepy Follow with Dr. Maple HudsonYoung in 2-3 months and as needed Please contact office for sooner follow up if symptoms do not improve or worsen or seek emergency care

## 2017-07-24 NOTE — Addendum Note (Signed)
Addended by: Boone MasterJONES, JESSICA E on: 07/24/2017 02:58 PM   Modules accepted: Orders

## 2017-07-24 NOTE — Addendum Note (Signed)
Addended by: Boone MasterJONES, JESSICA E on: 07/24/2017 02:52 PM   Modules accepted: Orders

## 2017-07-25 ENCOUNTER — Encounter: Payer: Self-pay | Admitting: *Deleted

## 2017-07-25 NOTE — Telephone Encounter (Signed)
Error. Nothing in message.  

## 2017-07-25 NOTE — Progress Notes (Signed)
Pt notified via MYCHART. 

## 2017-08-04 ENCOUNTER — Other Ambulatory Visit: Payer: Self-pay | Admitting: Geriatric Medicine

## 2017-08-04 DIAGNOSIS — R079 Chest pain, unspecified: Secondary | ICD-10-CM

## 2017-08-13 ENCOUNTER — Ambulatory Visit (INDEPENDENT_AMBULATORY_CARE_PROVIDER_SITE_OTHER): Payer: 59

## 2017-08-13 DIAGNOSIS — R079 Chest pain, unspecified: Secondary | ICD-10-CM | POA: Diagnosis not present

## 2017-08-13 LAB — EXERCISE TOLERANCE TEST
CHL CUP MPHR: 164 {beats}/min
CSEPHR: 98 %
CSEPPHR: 162 {beats}/min
Estimated workload: 7 METS
Exercise duration (min): 6 min
Exercise duration (sec): 0 s
RPE: 18
Rest HR: 93 {beats}/min

## 2017-08-29 ENCOUNTER — Ambulatory Visit
Admission: RE | Admit: 2017-08-29 | Discharge: 2017-08-29 | Disposition: A | Discharge status: Home / Self Care | Payer: 59 | Source: Ambulatory Visit | Attending: Internal Medicine | Admitting: Internal Medicine

## 2017-08-29 DIAGNOSIS — Z1231 Encounter for screening mammogram for malignant neoplasm of breast: Secondary | ICD-10-CM

## 2017-09-12 ENCOUNTER — Emergency Department (HOSPITAL_COMMUNITY)
Admission: EM | Admit: 2017-09-12 | Discharge: 2017-09-12 | Disposition: A | Discharge status: Home / Self Care | Payer: 59 | Attending: Physician Assistant | Admitting: Physician Assistant

## 2017-09-12 ENCOUNTER — Emergency Department (HOSPITAL_COMMUNITY): Payer: 59

## 2017-09-12 ENCOUNTER — Encounter (HOSPITAL_COMMUNITY): Payer: Self-pay | Admitting: Emergency Medicine

## 2017-09-12 DIAGNOSIS — Z79899 Other long term (current) drug therapy: Secondary | ICD-10-CM | POA: Insufficient documentation

## 2017-09-12 DIAGNOSIS — G4751 Confusional arousals: Secondary | ICD-10-CM | POA: Diagnosis not present

## 2017-09-12 DIAGNOSIS — Z563 Stressful work schedule: Secondary | ICD-10-CM | POA: Insufficient documentation

## 2017-09-12 DIAGNOSIS — F419 Anxiety disorder, unspecified: Secondary | ICD-10-CM | POA: Insufficient documentation

## 2017-09-12 DIAGNOSIS — Z566 Other physical and mental strain related to work: Secondary | ICD-10-CM

## 2017-09-12 LAB — URINALYSIS, ROUTINE W REFLEX MICROSCOPIC
BILIRUBIN URINE: NEGATIVE
Glucose, UA: NEGATIVE mg/dL
Hgb urine dipstick: NEGATIVE
KETONES UR: NEGATIVE mg/dL
Leukocytes, UA: NEGATIVE
Nitrite: NEGATIVE
PROTEIN: NEGATIVE mg/dL
SPECIFIC GRAVITY, URINE: 1.003 — AB (ref 1.005–1.030)
pH: 6 (ref 5.0–8.0)

## 2017-09-12 LAB — COMPREHENSIVE METABOLIC PANEL
ALK PHOS: 88 U/L (ref 38–126)
ALT: 18 U/L (ref 14–54)
AST: 21 U/L (ref 15–41)
Albumin: 3.8 g/dL (ref 3.5–5.0)
Anion gap: 6 (ref 5–15)
BILIRUBIN TOTAL: 0.4 mg/dL (ref 0.3–1.2)
BUN: 16 mg/dL (ref 6–20)
CALCIUM: 9.3 mg/dL (ref 8.9–10.3)
CO2: 26 mmol/L (ref 22–32)
CREATININE: 1.05 mg/dL — AB (ref 0.44–1.00)
Chloride: 106 mmol/L (ref 101–111)
GFR calc Af Amer: >60 mL/min (ref >60)
GFR, EST NON AFRICAN AMERICAN: 58 mL/min — AB (ref >60)
Glucose, Bld: 96 mg/dL (ref 65–99)
Potassium: 3.9 mmol/L (ref 3.5–5.1)
Sodium: 138 mmol/L (ref 135–145)
TOTAL PROTEIN: 6.9 g/dL (ref 6.5–8.1)

## 2017-09-12 LAB — CBC WITH DIFFERENTIAL/PLATELET
BASOS ABS: 0 10*3/uL (ref 0.0–0.1)
Basophils Relative: 0 %
Eosinophils Absolute: 0.3 10*3/uL (ref 0.0–0.7)
Eosinophils Relative: 4 %
HEMATOCRIT: 46 % (ref 36.0–46.0)
Hemoglobin: 14.5 g/dL (ref 12.0–15.0)
LYMPHS ABS: 1.8 10*3/uL (ref 0.7–4.0)
LYMPHS PCT: 24 %
MCH: 28.5 pg (ref 26.0–34.0)
MCHC: 31.5 g/dL (ref 30.0–36.0)
MCV: 90.6 fL (ref 78.0–100.0)
Monocytes Absolute: 0.6 10*3/uL (ref 0.1–1.0)
Monocytes Relative: 9 %
NEUTROS ABS: 4.6 10*3/uL (ref 1.7–7.7)
Neutrophils Relative %: 63 %
Platelets: 288 10*3/uL (ref 150–400)
RBC: 5.08 MIL/uL (ref 3.87–5.11)
RDW: 13.5 % (ref 11.5–15.5)
WBC: 7.3 10*3/uL (ref 4.0–10.5)

## 2017-09-12 NOTE — ED Notes (Signed)
Patient transported to CT 

## 2017-09-12 NOTE — Discharge Instructions (Signed)
Please try to practice good sleep hygeine. You can try Unisom (Doxylamine) to help you sleep at night. Take care of yourself!
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
## 2017-09-12 NOTE — ED Provider Notes (Signed)
[facility]ENCY DEPT Provider Note   CSN: [id number] Arrival date & time: 09/12/17  1618     History   Chief Complaint Chief Complaint  Patient presents with  . Altered Mental Status    HPI [name] is a 56 y.o. female.  HPI   Patient is 43 her old female presenting with multiple neurologic complaints. She reports that over the last year she's been having lots of symptoms such as difficulty concentrating, difficulty remembering what she is doing, and saying and doing things that were mildly different than what she would usually expect. Patient is tearful and reports that she's been working very long hours her job and sleeping very little, 2-3 hours tonight and working over 120 hours week.  She has terrible sleep hygiene, using screens until 2 or 3 in the morning.  Husband at bedside is very supportive and feels the patient is working too much.  Past Medical History:  Diagnosis Date  . Anxiety   . Osteoporosis 2016   T score -2.6 DEXA 2011 with T score -2.6  . Plantar fasciitis     Patient Active Problem List   Diagnosis Date Noted  . GERD (gastroesophageal reflux disease) 08/24/2013  . Acute bronchitis 08/24/2013  . Osteoporosis   . Unspecified vitamin D deficiency   . ALLERGIC CONJUNCTIVITIS 05/05/2008  . NASAL POLYP 05/05/2008  . Seasonal allergic rhinitis 05/05/2008  . Pneumothorax 05/05/2008  . BREAST MASS, BENIGN 05/05/2008    Past Surgical History:  Procedure Laterality Date  . APPENDECTOMY    . bladder distention  2009  . BREAST EXCISIONAL BIOPSY    . BREAST LUMPECTOMY     Benign-Bilateral  . KNEE SURGERY  04,05  . KNEE SURGERY  2016  . OOPHORECTOMY     laparoscopic BSO for benign cysts  . REFRACTIVE SURGERY  2016  . REPAIR OF RECTOCELE    . VAGINAL HYSTERECTOMY     early 2000's for uterine prolapse and rectocele    OB History    Gravida Para Term Preterm AB Living   SAB TAB Ectopic Multiple Live Births                    Home Medications    Prior to Admission medications   Medication Sig Start Date End Date Taking? Authorizing Provider  doxycycline (VIBRA-TABS) 100 MG tablet Take 100 mg by mouth 2 (two) times daily.    [provider]  doxycycline (VIBRA-TABS) 100 MG tablet Take 1 tablet (100 mg total) by mouth 2 (two) times daily. 07/24/17   Parrett, Virgel Bouquet, NP  estradiol (VIVELLE-DOT) 0.05 MG/24HR patch Place one patch twice a week. 06/18/17   Fontaine, Nadyne Coombes, MD  fluticasone furoate-vilanterol (BREO ELLIPTA) 100-25 MCG/INH AEPB Inhale 1 puff into the lungs daily.    [provider]  fluvoxaMINE (LUVOX) 100 MG tablet as directed. 04/08/16   [provider]  HYDROcodone-homatropine (HYDROMET) 5-1.5 MG/5ML syrup Take 5 mLs by mouth every 6 (six) hours as needed. 07/24/17   Parrett, Virgel Bouquet, NP  naproxen sodium (ALEVE) 220 MG tablet Take 220 mg by mouth 2 (two) times daily with a meal. Pt takes 2 tabs twice daily.    [provider]  pramipexole (MIRAPEX) 1.5 MG tablet Take 1 tablet by mouth daily. 04/08/16   [provider]  predniSONE (DELTASONE) 10 MG tablet 4 tabs for 2 days, then 3 tabs for 2 days, 2 tabs for  2 days, then 1 tab for 2 days, then stop 07/24/17   Parrett, Virgel Bouquet, NP  PROAIR HFA 108 (90 Base) MCG/ACT inhaler INL 2 PFS PO QID 07/19/17   [provider]  RABEprazole (ACIPHEX) 20 MG tablet Take 20 mg by mouth daily.    [provider]    Family History Family History  Problem Relation Age of Onset  . Hypertension Mother   . Diabetes Father   . Hypertension Father   . Heart disease Father   . Hypertension Sister   . Heart disease Paternal Uncle   . Diabetes Paternal Grandmother   . Heart disease Paternal Grandmother   . Diabetes Maternal Grandmother   . Heart disease Maternal Grandmother   . Diabetes Maternal Grandfather   . Heart disease Maternal Grandfather   . Diabetes Paternal Grandfather   . Heart disease Paternal  Grandfather     Social History Social History  Substance Use Topics  . Smoking status: Never Smoker  . Smokeless tobacco: Never Used  . Alcohol use 0.0 oz/week     Comment: rare     Allergies   Cephalexin; Decongestant [pseudoephedrine hcl er]; Erythromycin; Neomycin-bacitracin zn-polymyx; Petrol dist-piperonyl butoxide-pyrethrins [pyrethrins-piperonyl butoxide]; Promethazine hcl; Restasis [cyclosporine]; Sertraline hcl; and Codeine   Review of Systems Review of Systems  Constitutional: Negative for activity change and fatigue.  HENT: Negative for congestion and drooling.   Eyes: Negative for discharge.  Respiratory: Negative for cough and chest tightness.   Cardiovascular: Negative for chest pain.  Gastrointestinal: Negative for abdominal distention.  Genitourinary: Negative for difficulty urinating and dysuria.  Musculoskeletal: Negative for joint swelling.  Skin: Negative for rash.  Allergic/Immunologic: Negative for immunocompromised state.  Neurological: Negative for seizures and speech difficulty.  Psychiatric/Behavioral: Positive for behavioral problems, confusion, decreased concentration and sleep disturbance.  All other systems reviewed and are negative.    Physical Exam Updated Vital Signs BP (!) 142/91   Pulse 77   Temp 98 F (36.7 C) (Oral)   Resp (!) 23   Ht  (1.549 m)   Wt 72.6 kg (160 lb)   LMP 12/09/1996   SpO2 99%   BMI 30.23 kg/m   Physical Exam  Constitutional: She is oriented to person, place, and time. She appears well-developed and well-nourished.  HENT:  Head: Normocephalic and atraumatic.  Eyes: Right eye exhibits no discharge.  Cardiovascular: Normal rate and regular rhythm.   No murmur heard. Pulmonary/Chest: Effort normal and breath sounds normal. No respiratory distress. She has no wheezes.  Abdominal: She exhibits no distension. There is no tenderness.  Neurological: She is oriented to person, place, and time. No cranial  nerve deficit.  Skin: Skin is warm and dry. She is not diaphoretic.  Psychiatric: She has a normal mood and affect.  Nursing note and vitals reviewed.    ED Treatments / Results  Labs (all labs ordered are listed, but only abnormal results are displayed) Labs Reviewed  COMPREHENSIVE METABOLIC PANEL - Abnormal; Notable for the following:       Result Value   Creatinine, Ser 1.05 (*)    GFR calc non Af Amer 58 (*)    All other components within normal limits  URINALYSIS, ROUTINE W REFLEX MICROSCOPIC - Abnormal; Notable for the following:    Color, Urine COLORLESS (*)    Specific Gravity, Urine 1.003 (*)    All other components within normal limits  CBC WITH DIFFERENTIAL/PLATELET    EKG  EKG Interpretation None  Radiology No results found.  Procedures Procedures (including critical care time)  Medications Ordered in ED Medications - No data to display   Initial Impression / Assessment and Plan / ED Course  I have reviewed the triage vital signs and the nursing notes.  Pertinent labs & imaging results that were available during my care of the patient were reviewed by me and considered in my medical decision making (see chart for details).    Patient is 44 her old female presenting with multiple neurologic complaints. She reports that over the last year she's been having lots of symptoms such as difficulty concentrating, difficulty remembering what she is doing, and saying and doing things that were mildly different than what she would usually expect. Patient is tearful and reports that she's been working very long hours her job and sleeping very little, 2-3 hours tonight and working over 120 hours week.  She has terrible sleep hygiene, using screens until 2 or 3 in the morning.  Husband at bedside is very supportive and feels the patient is working too much.   8:32 PM Ultimately I think this is likely chronic. Admission. We'll encourage good sleep hygiene. We'll  have patient take time off work etc. Maryclare Labrador have her follow-up with her primary care physician for further care.  We'll give her referral for neurology. Suspect this is likely sleep deprivation/stress.  Final Clinical Impressions(s) / ED Diagnoses   Final diagnoses:  None    New Prescriptions New Prescriptions   No medications on file     Abelino Derrick, MD 09/12/17 2317

## 2017-09-12 NOTE — ED Notes (Signed)
Pt states she is not confused; states, "I'll be in the middle of the day and do things and say things that I don't remember, and when I look back I don't remember that I've done it." Pt reports working 100 hours a week and sleeping 2-3 hours each night. Pt tearful on assessment. A&Ox4. No neuro deficits.

## 2017-10-28 ENCOUNTER — Encounter: Payer: Self-pay | Admitting: Neurology

## 2017-10-29 ENCOUNTER — Ambulatory Visit: Payer: 59 | Admitting: Neurology

## 2017-10-29 ENCOUNTER — Encounter: Payer: Self-pay | Admitting: Neurology

## 2017-10-29 VITALS — BP 124/81 | HR 97 | Ht 61.0 in | Wt 166.5 lb

## 2017-10-29 DIAGNOSIS — F422 Mixed obsessional thoughts and acts: Secondary | ICD-10-CM | POA: Diagnosis not present

## 2017-10-29 DIAGNOSIS — G4710 Hypersomnia, unspecified: Secondary | ICD-10-CM | POA: Diagnosis not present

## 2017-10-29 DIAGNOSIS — F428 Other obsessive-compulsive disorder: Secondary | ICD-10-CM | POA: Insufficient documentation

## 2017-10-29 DIAGNOSIS — F509 Eating disorder, unspecified: Secondary | ICD-10-CM | POA: Insufficient documentation

## 2017-10-29 DIAGNOSIS — R6889 Other general symptoms and signs: Secondary | ICD-10-CM | POA: Insufficient documentation

## 2017-10-29 DIAGNOSIS — F446 Conversion disorder with sensory symptom or deficit: Secondary | ICD-10-CM

## 2017-10-29 DIAGNOSIS — G2581 Restless legs syndrome: Secondary | ICD-10-CM | POA: Insufficient documentation

## 2017-10-29 MED ORDER — ROPINIROLE HCL 1 MG PO TABS: 1.0000 mg | ORAL_TABLET | Freq: Three times a day (TID) | ORAL | 2 refills | Status: DC

## 2017-10-29 NOTE — Progress Notes (Signed)
SLEEP MEDICINE CLINIC   Provider:  Melvyn Novasarmen  Kathan Kirker, M D  Primary Care Physician:  Marden NobleGates, Robert, MD   Referring Provider: Marden NobleGates, Robert, MD    Chief Complaint  Patient presents with  . New Patient (Initial Visit)    HPI:  [name] is a 56 y.o. female , seen here in a referral  from Dr. Kevan NyGates for evaluation / sleep consultation.  [name] has been an established patient of Dr. Kevan NyGates for over 20 years, and she recently experienced episodic lapses in awareness and consciousness.  Dr. Kevan NyGates specifically asked to rule out seizures. [name] and was cautioned by her primary care physician not to drive until her spells have been fully evaluated.   The onset of her confusional spells or alteration of awareness was dated to March 2017.  She describes writing emails with nonsensical inclusions , other thoughts intruded into her workflow.  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 1 of the more recent examples of the conversation that she initiated with her daughter, apparently discussing "the gender of Pinocchio", when her daughter asked her if she recalled any of the conversation, probably 5-10 minutes after it concluded.  She could not. At times she will feel as if she day dreams as if her dreams intrudes into a.  Of concentration of free thoughts.  These kind of lucid dreams takeover her thought process for a limited period of time but she then has trouble to return to the original task. She reports that she is more hypervigilant and easy to startle now, which is also different to how she felt and acted before.  She has also been semi-asleep at her computer typing rows of ccccc. She does not recall dreaming vividly at night however.She has difficulties initiating sleep in the first place, and Dr. Kevan NyGates prescribed first temazepam and  lorazepam-she has failed several sleeping pills but Xanax and Ativan have helped.  She has tried Ambien and Lunesta but states that none of them worked for her.   "I don't like sleep    The patient is currently on the following medication: Mirapex, 1.5 mg cetirizine as needed, Rhinocort as needed, lorazepam up to twice a day as needed, Pepcid, Vivelle-Dot, rabeprazole proton pump inhibitor, Brio Ellipta inhaler, she no longer takes fluvoxamine or temazepam, but started on Effexor 75 mg.  Sleep habits are as follows: She reports very fragmented sleep, sleeping and portions of 2-3 hours interrupted by periods of wakefulness.  Sometimes she will not go back to sleep after only 3 hours of nocturnal sleep time. Due to work requirements she often feels that her bedtime has to be arranged accordingly.  It may therefore be very variable between 10 PM and midnight. With sleep aids, and her case benzodiazepines, she will fall asleep promptly and sleeps for up to 6 hours uninterrupted.  He feels refreshed and restored in the morning.I sleep on top of the covers, her bedroom is described as cool, quiet and dark.  States on her stomach or back. 2 pillows. Nocturia 2 times.   Her husband has reported that she snores now, after gaining about 50 pounds of body weight in less than 3 years. She wakes up in the morning with a very dry mouth, but usually not with headaches, not with palpitations diaphoresis, nausea or dizziness.  The patient doesn't nap in  daytime .But she feels very sleepy driving.    Sleep medical history and family sleep history: The patient had a total hysterectomy at [age], surgically induced menopause before age 56, she developed osteopenia was followed by Dr. Eda PaschalGottsegen.  The patient also had a history of bulimia.  She has not displayed symptoms of bulimia for the last 6 month.  Social history: Married, 56 year old, 56 year old and 2 adopted daughters from [place], 5715 and 56 years old.  2  grandchildren.  Recent history of shift work. Travels to [place] a lot.  The patient never used tobacco products, she does not drink alcohol, but drinks lots of caffeinated beverages, diet Dr. Reino KentPepper is her downfall, and sweet iced tea,one coffee , but not energy drinks.    Review of Systems: He reports a lot of joint pain and achiness with a feeling of stiffness, sometimes jumping movements myoclonic jerks, daydreaming.  This in the big toe and grip strength weakness.  In addition aching muscles, respiratory allergies, hearing loss, shortness of breath and weight gain.  Restless legs and daytime sleepiness. Out of a complete 14 system review, the patient complains of only the following symptoms, and all other reviewed systems are negative. How likely are you to doze in the following situations: 0 = not likely, 1 = slight chance, 2 = moderate chance, 3 = high chance  Sitting and Reading? 2 Watching Television? 3 Sitting inactive in a public place (theater or meeting)? 0 Lying down in the afternoon when circumstances permit? 3 Sitting and talking to someone? 2 in meetings  Sitting quietly after lunch without alcohol? 3 In a car, while stopped for a few minutes in traffic? 2 As a passenger in a car for an hour without a break?3  Total =18    Epworth score 18 , Fatigue severity score n/a . The patient has not experienced sleep paralysis, but dream intrusions and vivid dreams lucid dreams.  Usually these daydreams are lucid and vivid as well.  At night however they seem not to be as intrusive. She denies cataplexy.   Social History   Socioeconomic History  . Marital status: Married    Spouse name: Not on file  . Number of children: Not on file  . Years of education: Not on file  . Highest education level: Not on file  Social Needs  . Financial resource strain: Not on file  . Food insecurity - worry: Not on file  . Food insecurity - inability: Not on file  . Transportation needs -  medical: Not on file  . Transportation needs - non-medical: Not on file  Occupational History  . Not on file  Tobacco Use  . Smoking status: Never Smoker  . Smokeless tobacco: Never Used  Substance and Sexual Activity  . Alcohol use: Yes    Alcohol/week: 0.0 oz    Comment: rare  . Drug use: No  . Sexual activity: Yes    Birth control/protection: Surgical    Comment: 1st intercourse 56 yo-More than 5 partners  Other Topics Concern  . Not on file  Social History Narrative  . Not on file    Family History  Problem Relation Age of Onset  . Hypertension Mother   . Diabetes Father   . Hypertension Father   . Heart disease Father   . Hypertension Sister   . Heart disease Paternal Uncle   . Diabetes Paternal Grandmother   . Heart disease Paternal Grandmother   . Diabetes Maternal  Grandmother   . Heart disease Maternal Grandmother   . Diabetes Maternal Grandfather   . Heart disease Maternal Grandfather   . Diabetes Paternal Grandfather   . Heart disease Paternal Grandfather     Past Medical History:  Diagnosis Date  . Allergic rhinitis   . Anxiety   . GERD (gastroesophageal reflux disease)   . IBS (irritable bowel syndrome)   . Insomnia   . Osteopenia   . Osteoporosis 2016   T score -2.6 DEXA 2011 with T score -2.6  . Plantar fasciitis     Past Surgical History:  Procedure Laterality Date  . APPENDECTOMY    . bladder distention  2009  . BREAST EXCISIONAL BIOPSY    . BREAST LUMPECTOMY     Benign-Bilateral  . KNEE SURGERY  04,05  . KNEE SURGERY  2016  . OOPHORECTOMY     laparoscopic BSO for benign cysts  . REFRACTIVE SURGERY  2016  . REPAIR OF RECTOCELE    . VAGINAL HYSTERECTOMY     early 2000's for uterine prolapse and rectocele    Current Outpatient Medications  Medication Sig Dispense Refill  . budesonide (RHINOCORT ALLERGY) 32 MCG/ACT nasal spray Place 2 sprays into both nostrils daily.    . cetirizine (ZYRTEC) 10 MG tablet     . estradiol  (VIVELLE-DOT) 0.05 MG/24HR patch     . famotidine (PEPCID) 20 MG tablet TK 1 T PO QD HS  2  . fluticasone furoate-vilanterol (BREO ELLIPTA) 100-25 MCG/INH AEPB Inhale 1 puff into the lungs daily.    Marland Kitchen LORazepam (ATIVAN) 1 MG tablet TK 1/2 TO 1 T PO BID PRN  3  . pramipexole (MIRAPEX) 1.5 MG tablet Take 1 tablet by mouth daily.    Marland Kitchen PROAIR HFA 108 (90 Base) MCG/ACT inhaler INL 2 PFS PO QID  0  . RABEprazole (ACIPHEX) 20 MG tablet Take 20 mg by mouth daily.     No current facility-administered medications for this visit.     Allergies as of 10/29/2017 - Review Complete 10/29/2017  Allergen Reaction Noted  . Cephalexin    . Decongestant [pseudoephedrine hcl er]  08/17/2013  . Erythromycin    . Neomycin-bacitracin zn-polymyx  04/13/2008  . Petrol dist-piperonyl butoxide-pyrethrins [pyrethrins-piperonyl butoxide]    . Promethazine hcl    . Restasis [cyclosporine]    . Sertraline hcl  04/13/2008  . Codeine Other (See Comments)     Vitals: BP 124/81   Pulse 97   Ht 5\' 1"  (1.549 m)   Wt 166 lb 8 oz (75.5 kg)   LMP 12/09/1996   BMI 31.46 kg/m  Last Weight:  Wt Readings from Last 1 Encounters:  10/29/17 166 lb 8 oz (75.5 kg)   ZOX:WRUE mass index is 31.46 kg/m.     Last Height:   Ht Readings from Last 1 Encounters:  10/29/17 5\' 1"  (1.549 m)    Physical exam:  General: The patient is awake, alert and appears not in acute distress. The patient is well groomed. Head: Normocephalic, atraumatic. Neck is supple. Mallampati 3-4,  neck circumference:14  Nasal airflow - nasal septal deviation- left nasion obstructed.  , TMJ click is evident . Retrognathia is not seen. bruxism marks noted- she has a dental guard .  Cardiovascular:  Regular rate and rhythm , without  murmurs or carotid bruit, and without distended neck veins. Respiratory: Lungs are clear to auscultation. Skin:  Without evidence of edema, or rash Trunk: BMI is 32. The patient's posture is erect  Neurologic exam : The  patient is awake and alert, oriented to place and time.   Memory subjective described as intact.  ttention span & concentration ability appears normal.  Speech is fluent,  without dysarthria, dysphonia or aphasia.  Mood and affect are appropriate.  Cranial nerves: Pupils are equal and briskly reactive to light. Funduscopic exam without evidence of pallor or edema. Extraocular movements  in vertical and horizontal planes intact and without nystagmus. Visual fields by finger perimetry are intact. Hearing to finger rub intact. Facial sensation intact to fine touch. Facial motor strength is symmetric and tongue and uvula move midline. Shoulder shrug was symmetrical.   Motor exam: Normal tone, muscle bulk and symmetric strength in all extremities. She reports loss of grip strength.  Sensory:  Fine touch, pinprick and vibration were tested in all extremities. Proprioception tested in the upper extremities was normal. Coordination: Rapid alternating movements in the fingers/hands was normal. Finger-to-nose maneuver  normal without evidence of ataxia, dysmetria or tremor. Gait and station: Patient walks without assistive device and is able unassisted to climb up to the exam table. Strength within normal limits.Stance is stable and normal.  Tandem gait is unfragmented. Turns with 3 Steps. Romberg testing is negative. Deep tendon reflexes: in the  upper and lower extremities are symmetric and very brisk . Babinski maneuver response is downgoing.   Assessment:  After physical and neurologic examination, review of laboratory studies,  Personal review of imaging studies, reports of other /same  Imaging studies, results of polysomnography and / or neurophysiology testing and pre-existing records as far as provided in visit., my assessment is   1)   Mrs. Izola Price report is very interesting.  Her thought intrusions without losing body function, motor control or the ability to hear speak or see is unusual for seizure  patient.  However, she could have Seizures of Absence type.  I would like for her to undergo a prolonged EEG of at least 2 hours duration. I have made her aware that I am concerned about her driving and would like her not to.  There is imminent danger for her on the road and for other traffic participants should she crash.  I explained that sleep deprivation is also a trigger for seizures so a very restricted time of sleep may lead to more spells in daytime.  2) she describes a very irregular, almost erratic, sleep pattern.  She has more vivid dreams and daytime and at nighttime.  I wonder if this could be second sleep intrusions and these are strongly related to the use of dopaminergic agonists, such as Mirapex and Requip.  These also foster visual hallucinations.  We may have to change her to a completely different restless leg regimen. She feels the increase in dose started her spells.   3) she has insomnia and we have not been able to work her up.  If there are organic reasons such as snoring accompanied by apnea, periodic limb movements, and she describes microsleep attacks in daytime.  She is unfortunatly on Effexor which works for OCD and anxiety very well but prohibits MSLT evaluation.     The patient was advised of the nature of the diagnosed disorder , the treatment options and the  risks for general health and wellness arising from not treating the condition.   I spent more than 6-0 minutes of face to face time with the patient.  Greater than 50% of time was spent in counseling and coordination of care. We have  discussed the diagnosis and differential and I answered the patient's questions.    Plan:  Treatment plan and additional workup :  I will invite Mrs. Meyer for a 2-hour prolonged adult EEG, I will also order a polysomnographic study with EEG montage, if neither apnea nor periodic limb movements are noted  Will pursue narcolepsy work up.  Will work on [employer]. HYPERSOMNIA and  Compulusions- eating, shopping, and thought intrusions- stop mirapex  Melvyn Novas, MD 10/29/2017, 2:42 PM  Certified in Neurology by ABPN Certified in Sleep Medicine by [facility] [address], [address] [place], [place] [address]

## 2017-11-06 ENCOUNTER — Ambulatory Visit: Payer: 59 | Admitting: Neurology

## 2017-11-14 ENCOUNTER — Ambulatory Visit: Payer: 59 | Admitting: Internal Medicine

## 2017-11-21 ENCOUNTER — Telehealth: Payer: Self-pay | Admitting: Neurology

## 2017-11-21 NOTE — Telephone Encounter (Signed)
We have attempted to call the patient 2 times to schedule sleep study. Patient has been unavailable at the phone numbers we have on file and has not returned our calls. At this point we will send a letter asking pt to please contact the sleep lab to schedule their sleep study. If patient calls back we will schedule them for their sleep study. ° °

## 2017-12-18 ENCOUNTER — Other Ambulatory Visit: Payer: Self-pay | Admitting: Orthopedic Surgery

## 2017-12-25 ENCOUNTER — Encounter (HOSPITAL_BASED_OUTPATIENT_CLINIC_OR_DEPARTMENT_OTHER): Payer: Self-pay | Admitting: *Deleted

## 2018-01-01 ENCOUNTER — Encounter (HOSPITAL_BASED_OUTPATIENT_CLINIC_OR_DEPARTMENT_OTHER): Payer: Self-pay | Admitting: *Deleted

## 2018-01-01 ENCOUNTER — Ambulatory Visit (HOSPITAL_BASED_OUTPATIENT_CLINIC_OR_DEPARTMENT_OTHER): Payer: 59 | Admitting: Certified Registered"

## 2018-01-01 ENCOUNTER — Encounter (HOSPITAL_BASED_OUTPATIENT_CLINIC_OR_DEPARTMENT_OTHER)
Admission: RE | Disposition: A | Discharge status: Home / Self Care | Payer: Self-pay | Source: Ambulatory Visit | Attending: Orthopedic Surgery

## 2018-01-01 ENCOUNTER — Other Ambulatory Visit: Payer: Self-pay

## 2018-01-01 ENCOUNTER — Ambulatory Visit (HOSPITAL_BASED_OUTPATIENT_CLINIC_OR_DEPARTMENT_OTHER)
Admission: RE | Admit: 2018-01-01 | Discharge: 2018-01-01 | Disposition: A | Discharge status: Home / Self Care | Payer: 59 | Source: Ambulatory Visit | Attending: Orthopedic Surgery | Admitting: Orthopedic Surgery

## 2018-01-01 DIAGNOSIS — Q661 Congenital talipes calcaneovarus: Secondary | ICD-10-CM | POA: Diagnosis not present

## 2018-01-01 DIAGNOSIS — Z885 Allergy status to narcotic agent status: Secondary | ICD-10-CM | POA: Insufficient documentation

## 2018-01-01 DIAGNOSIS — K589 Irritable bowel syndrome without diarrhea: Secondary | ICD-10-CM | POA: Diagnosis not present

## 2018-01-01 DIAGNOSIS — Z881 Allergy status to other antibiotic agents status: Secondary | ICD-10-CM | POA: Diagnosis not present

## 2018-01-01 DIAGNOSIS — F419 Anxiety disorder, unspecified: Secondary | ICD-10-CM | POA: Insufficient documentation

## 2018-01-01 DIAGNOSIS — X58XXXA Exposure to other specified factors, initial encounter: Secondary | ICD-10-CM | POA: Diagnosis not present

## 2018-01-01 DIAGNOSIS — S86311A Strain of muscle(s) and tendon(s) of peroneal muscle group at lower leg level, right leg, initial encounter: Secondary | ICD-10-CM | POA: Diagnosis not present

## 2018-01-01 DIAGNOSIS — M65861 Other synovitis and tenosynovitis, right lower leg: Secondary | ICD-10-CM | POA: Insufficient documentation

## 2018-01-01 DIAGNOSIS — M722 Plantar fascial fibromatosis: Secondary | ICD-10-CM | POA: Insufficient documentation

## 2018-01-01 DIAGNOSIS — Z79899 Other long term (current) drug therapy: Secondary | ICD-10-CM | POA: Diagnosis not present

## 2018-01-01 DIAGNOSIS — G4700 Insomnia, unspecified: Secondary | ICD-10-CM | POA: Diagnosis not present

## 2018-01-01 DIAGNOSIS — K219 Gastro-esophageal reflux disease without esophagitis: Secondary | ICD-10-CM | POA: Insufficient documentation

## 2018-01-01 HISTORY — PX: TENDON TRANSFER: SHX6109

## 2018-01-01 HISTORY — PX: CALCANEAL OSTEOTOMY: SHX1281

## 2018-01-01 HISTORY — PX: METATARSAL OSTEOTOMY: SHX1641

## 2018-01-01 SURGERY — OSTEOTOMY, CALCANEUS
Anesthesia: General | Site: Foot | Laterality: Right

## 2018-01-01 MED ORDER — OXYCODONE HCL 5 MG/5ML PO SOLN
ORAL | Status: AC
  Filled 2018-01-01: qty 5

## 2018-01-01 MED ORDER — 0.9 % SODIUM CHLORIDE (POUR BTL) OPTIME
TOPICAL | Status: DC | PRN
  Administered 2018-01-01: 200 mL

## 2018-01-01 MED ORDER — CEFAZOLIN SODIUM-DEXTROSE 2-4 GM/100ML-% IV SOLN
2.0000 g | INTRAVENOUS | Status: AC
  Administered 2018-01-01: 2 g via INTRAVENOUS

## 2018-01-01 MED ORDER — OXYCODONE HCL 5 MG PO TABS: 5.0000 mg | ORAL_TABLET | ORAL | 0 refills | Status: DC | PRN

## 2018-01-01 MED ORDER — FENTANYL CITRATE (PF) 100 MCG/2ML IJ SOLN
50.0000 ug | INTRAMUSCULAR | Status: DC | PRN
  Administered 2018-01-01: 50 ug via INTRAVENOUS

## 2018-01-01 MED ORDER — LACTATED RINGERS IV SOLN
INTRAVENOUS | Status: DC
  Administered 2018-01-01 (×2): via INTRAVENOUS

## 2018-01-01 MED ORDER — DEXAMETHASONE SODIUM PHOSPHATE 10 MG/ML IJ SOLN
INTRAMUSCULAR | Status: DC | PRN
  Administered 2018-01-01: 10 mg via INTRAVENOUS

## 2018-01-01 MED ORDER — BUPIVACAINE-EPINEPHRINE (PF) 0.5% -1:200000 IJ SOLN
INTRAMUSCULAR | Status: DC | PRN
  Administered 2018-01-01: 30 mL via PERINEURAL

## 2018-01-01 MED ORDER — OXYCODONE HCL 5 MG PO TABS
5.0000 mg | ORAL_TABLET | Freq: Once | ORAL | Status: AC
  Administered 2018-01-01: 5 mg via ORAL

## 2018-01-01 MED ORDER — ONDANSETRON HCL 4 MG/2ML IJ SOLN: 4.0000 mg | Freq: Once | INTRAMUSCULAR | Status: DC | PRN

## 2018-01-01 MED ORDER — LIDOCAINE HCL (CARDIAC) 20 MG/ML IV SOLN
INTRAVENOUS | Status: DC | PRN
  Administered 2018-01-01: 60 mg via INTRAVENOUS

## 2018-01-01 MED ORDER — SCOPOLAMINE 1 MG/3DAYS TD PT72: 1.0000 | MEDICATED_PATCH | Freq: Once | TRANSDERMAL | Status: DC | PRN

## 2018-01-01 MED ORDER — CEFAZOLIN SODIUM-DEXTROSE 2-4 GM/100ML-% IV SOLN
INTRAVENOUS | Status: AC
  Filled 2018-01-01: qty 100

## 2018-01-01 MED ORDER — MIDAZOLAM HCL 2 MG/2ML IJ SOLN
1.0000 mg | INTRAMUSCULAR | Status: DC | PRN
  Administered 2018-01-01: 2 mg via INTRAVENOUS

## 2018-01-01 MED ORDER — SODIUM CHLORIDE 0.9 % IV SOLN: INTRAVENOUS | Status: DC

## 2018-01-01 MED ORDER — SENNA 8.6 MG PO TABS: 2.0000 | ORAL_TABLET | Freq: Two times a day (BID) | ORAL | 0 refills | Status: DC

## 2018-01-01 MED ORDER — CHLORHEXIDINE GLUCONATE 4 % EX LIQD: 60.0000 mL | Freq: Once | CUTANEOUS | Status: DC

## 2018-01-01 MED ORDER — ONDANSETRON HCL 4 MG/2ML IJ SOLN
INTRAMUSCULAR | Status: DC | PRN
  Administered 2018-01-01: 4 mg via INTRAVENOUS

## 2018-01-01 MED ORDER — FENTANYL CITRATE (PF) 100 MCG/2ML IJ SOLN
INTRAMUSCULAR | Status: AC
  Filled 2018-01-01: qty 2

## 2018-01-01 MED ORDER — OXYCODONE HCL 5 MG PO TABS
ORAL_TABLET | ORAL | Status: AC
  Filled 2018-01-01: qty 1

## 2018-01-01 MED ORDER — ONDANSETRON HCL 4 MG PO TABS: 4.0000 mg | ORAL_TABLET | Freq: Every day | ORAL | 0 refills | Status: DC | PRN

## 2018-01-01 MED ORDER — ASPIRIN EC 81 MG PO TBEC: 81.0000 mg | DELAYED_RELEASE_TABLET | Freq: Two times a day (BID) | ORAL | 0 refills | Status: DC

## 2018-01-01 MED ORDER — PROPOFOL 10 MG/ML IV BOLUS
INTRAVENOUS | Status: DC | PRN
  Administered 2018-01-01: 100 mg via INTRAVENOUS

## 2018-01-01 MED ORDER — DOCUSATE SODIUM 100 MG PO CAPS: 100.0000 mg | ORAL_CAPSULE | Freq: Two times a day (BID) | ORAL | 0 refills | Status: DC

## 2018-01-01 MED ORDER — SUCCINYLCHOLINE CHLORIDE 20 MG/ML IJ SOLN
INTRAMUSCULAR | Status: DC | PRN
  Administered 2018-01-01: 100 mg via INTRAVENOUS

## 2018-01-01 MED ORDER — EPHEDRINE SULFATE 50 MG/ML IJ SOLN
INTRAMUSCULAR | Status: DC | PRN
  Administered 2018-01-01 (×3): 10 mg via INTRAVENOUS

## 2018-01-01 MED ORDER — MIDAZOLAM HCL 2 MG/2ML IJ SOLN
INTRAMUSCULAR | Status: AC
  Filled 2018-01-01: qty 2

## 2018-01-01 MED ORDER — FENTANYL CITRATE (PF) 100 MCG/2ML IJ SOLN: 25.0000 ug | INTRAMUSCULAR | Status: DC | PRN

## 2018-01-01 SURGICAL SUPPLY — 90 items
BANDAGE ESMARK 6X9 LF (GAUZE/BANDAGES/DRESSINGS) IMPLANT
BIT DRILL 2.5X2.75 QC CALB (BIT) ×1 IMPLANT
BLADE AVERAGE 25X9 (BLADE) ×1 IMPLANT
BLADE LONG MED 25X9 (BLADE) IMPLANT
BLADE MICRO SAGITTAL (BLADE) ×3 IMPLANT
BLADE OSC/SAG .038X5.5 CUT EDG (BLADE) IMPLANT
BLADE SURG 15 STRL LF DISP TIS (BLADE) ×4 IMPLANT
BLADE SURG 15 STRL SS (BLADE) ×9
BNDG CMPR 9X6 STRL LF SNTH (GAUZE/BANDAGES/DRESSINGS)
BNDG COHESIVE 4X5 TAN STRL (GAUZE/BANDAGES/DRESSINGS) ×3 IMPLANT
BNDG COHESIVE 6X5 TAN STRL LF (GAUZE/BANDAGES/DRESSINGS) ×3 IMPLANT
BNDG CONFORM 3 STRL LF (GAUZE/BANDAGES/DRESSINGS) ×3 IMPLANT
BNDG ESMARK 6X9 LF (GAUZE/BANDAGES/DRESSINGS)
CANISTER SUCT 1200ML W/VALVE (MISCELLANEOUS) ×3 IMPLANT
CHLORAPREP W/TINT 26ML (MISCELLANEOUS) ×3 IMPLANT
COVER BACK TABLE 60X90IN (DRAPES) ×3 IMPLANT
CUFF TOURNIQUET SINGLE 24IN (TOURNIQUET CUFF) IMPLANT
CUFF TOURNIQUET SINGLE 34IN LL (TOURNIQUET CUFF) ×3 IMPLANT
DRAPE EXTREMITY T 121X128X90 (DRAPE) ×3 IMPLANT
DRAPE OEC MINIVIEW 54X84 (DRAPES) ×3 IMPLANT
DRAPE U-SHAPE 47X51 STRL (DRAPES) ×3 IMPLANT
DRSG MEPITEL 4X7.2 (GAUZE/BANDAGES/DRESSINGS) ×3 IMPLANT
DRSG PAD ABDOMINAL 8X10 ST (GAUZE/BANDAGES/DRESSINGS) ×6 IMPLANT
ELECT REM PT RETURN 9FT ADLT (ELECTROSURGICAL) ×3
ELECTRODE REM PT RTRN 9FT ADLT (ELECTROSURGICAL) ×2 IMPLANT
GAUZE SPONGE 4X4 12PLY STRL (GAUZE/BANDAGES/DRESSINGS) ×3 IMPLANT
GLOVE BIO SURGEON STRL SZ 6.5 (GLOVE) ×1 IMPLANT
GLOVE BIO SURGEON STRL SZ8 (GLOVE) ×3 IMPLANT
GLOVE BIOGEL PI IND STRL 6.5 (GLOVE) IMPLANT
GLOVE BIOGEL PI IND STRL 7.0 (GLOVE) IMPLANT
GLOVE BIOGEL PI IND STRL 8 (GLOVE) ×4 IMPLANT
GLOVE BIOGEL PI INDICATOR 6.5 (GLOVE) ×1
GLOVE BIOGEL PI INDICATOR 7.0 (GLOVE) ×2
GLOVE BIOGEL PI INDICATOR 8 (GLOVE) ×2
GLOVE ECLIPSE 6.5 STRL STRAW (GLOVE) ×1 IMPLANT
GLOVE ECLIPSE 8.0 STRL XLNG CF (GLOVE) ×3 IMPLANT
GOWN STRL REUS W/ TWL LRG LVL3 (GOWN DISPOSABLE) ×2 IMPLANT
GOWN STRL REUS W/ TWL XL LVL3 (GOWN DISPOSABLE) ×4 IMPLANT
GOWN STRL REUS W/TWL LRG LVL3 (GOWN DISPOSABLE) ×6
GOWN STRL REUS W/TWL XL LVL3 (GOWN DISPOSABLE) ×6
K-WIRE ACE 1.6X6 (WIRE) ×6
K-WIRE DBL END .054 LG (WIRE) IMPLANT
KWIRE ACE 1.6X6 (WIRE) IMPLANT
NDL HYPO 25X1 1.5 SAFETY (NEEDLE) IMPLANT
NDL SUT 6 .5 CRC .975X.05 MAYO (NEEDLE) IMPLANT
NEEDLE HYPO 22GX1.5 SAFETY (NEEDLE) IMPLANT
NEEDLE HYPO 25X1 1.5 SAFETY (NEEDLE) ×3 IMPLANT
NEEDLE MAYO TAPER (NEEDLE)
NS IRRIG 1000ML POUR BTL (IV SOLUTION) ×3 IMPLANT
PACK BASIN DAY SURGERY FS (CUSTOM PROCEDURE TRAY) ×3 IMPLANT
PAD CAST 4YDX4 CTTN HI CHSV (CAST SUPPLIES) ×2 IMPLANT
PADDING CAST ABS 4INX4YD NS (CAST SUPPLIES)
PADDING CAST ABS COTTON 4X4 ST (CAST SUPPLIES) IMPLANT
PADDING CAST COTTON 4X4 STRL (CAST SUPPLIES) ×3
PADDING CAST COTTON 6X4 STRL (CAST SUPPLIES) ×3 IMPLANT
PENCIL BUTTON HOLSTER BLD 10FT (ELECTRODE) ×3 IMPLANT
PLATE ACE 3.5MM 2HOLE (Plate) ×1 IMPLANT
SANITIZER HAND PURELL 535ML FO (MISCELLANEOUS) ×3 IMPLANT
SCREW ACE CAN 4.0 55M (Screw) ×2 IMPLANT
SCREW CORTICAL 3.5MM 18MM (Screw) ×1 IMPLANT
SCREW CORTICAL 3.5MM 26MM (Screw) ×1 IMPLANT
SHEET MEDIUM DRAPE 40X70 STRL (DRAPES) ×3 IMPLANT
SLEEVE SCD COMPRESS KNEE MED (MISCELLANEOUS) ×3 IMPLANT
SPLINT FAST PLASTER 5X30 (CAST SUPPLIES) ×20
SPLINT PLASTER CAST FAST 5X30 (CAST SUPPLIES) ×40 IMPLANT
SPONGE LAP 18X18 X RAY DECT (DISPOSABLE) ×3 IMPLANT
STOCKINETTE 6  STRL (DRAPES) ×1
STOCKINETTE 6 STRL (DRAPES) ×2 IMPLANT
SUCTION FRAZIER HANDLE 10FR (MISCELLANEOUS) ×1
SUCTION TUBE FRAZIER 10FR DISP (MISCELLANEOUS) ×2 IMPLANT
SUT 2 FIBERLOOP 20 STRT BLUE (SUTURE)
SUT BONE WAX W31G (SUTURE) IMPLANT
SUT ETHIBOND 2 OS 4 DA (SUTURE) IMPLANT
SUT ETHILON 3 0 PS 1 (SUTURE) ×5 IMPLANT
SUT FIBERWIRE #2 38 T-5 BLUE (SUTURE)
SUT FIBERWIRE 2-0 18 17.9 3/8 (SUTURE)
SUT MNCRL AB 3-0 PS2 18 (SUTURE) ×3 IMPLANT
SUT VIC AB 0 SH 27 (SUTURE) ×4 IMPLANT
SUT VIC AB 2-0 SH 27 (SUTURE) ×3
SUT VIC AB 2-0 SH 27XBRD (SUTURE) IMPLANT
SUT VICRYL 0 UR6 27IN ABS (SUTURE) IMPLANT
SUTURE 2 FIBERLOOP 20 STRT BLU (SUTURE) IMPLANT
SUTURE FIBERWR #2 38 T-5 BLUE (SUTURE) IMPLANT
SUTURE FIBERWR 2-0 18 17.9 3/8 (SUTURE) IMPLANT
SYR BULB 3OZ (MISCELLANEOUS) ×3 IMPLANT
SYR CONTROL 10ML LL (SYRINGE) IMPLANT
TOWEL OR 17X24 6PK STRL BLUE (TOWEL DISPOSABLE) ×6 IMPLANT
TUBE CONNECTING 20X1/4 (TUBING) ×3 IMPLANT
UNDERPAD 30X30 (UNDERPADS AND DIAPERS) ×3 IMPLANT
YANKAUER SUCT BULB TIP NO VENT (SUCTIONS) IMPLANT

## 2018-01-01 NOTE — Progress Notes (Signed)
Assisted Dr. Turk with right, ultrasound guided, popliteal block. Side rails up, monitors on throughout procedure. See vital signs in flow sheet. Tolerated Procedure well. 

## 2018-01-01 NOTE — Transfer of Care (Signed)
Immediate Anesthesia Transfer of Care Note  Patient: [name] AprilChristy [name]  Procedure(s) Performed: Right Lateral Calcaneal Osteotomy (Right Foot) Right Peroneal Longus Tendon Debridement; Transfer of Peroneal Brevis to Longus Tendon (Right Ankle) Dorsiflexion Osteotomy of the First Metatarsal; Plantar Fascia Release (Right Foot)  Patient Location: PACU  Anesthesia Type:GA combined with regional for post-op pain  Level of Consciousness: awake, sedated and patient cooperative  Airway & Oxygen Therapy: Patient Spontanous Breathing and Patient connected to face mask oxygen  Post-op Assessment: Report given to RN and Post -op Vital signs reviewed and stable  Post vital signs: Reviewed and stable  Last Vitals:  Vitals:   01/01/18 1017 01/01/18 1024  BP: 108/65   Pulse: 78 94  Resp: 13 18  Temp:    SpO2: 97% 100%    Last Pain:  Vitals:   01/01/18 0943  TempSrc: Oral  PainSc: 2          Complications: No apparent anesthesia complications

## 2018-01-01 NOTE — Op Note (Signed)
01/01/2018  12:04 PM  PATIENT:  [name]  57 y.o. female  PRE-OPERATIVE DIAGNOSIS:   1.  Right peroneus brevis tear      2.  Right cavovarus foot deformity      3.  Right chronic plantar fasciitis  POST-OPERATIVE DIAGNOSIS: 1.  Right peroneus brevis tear      2.  Right cavovarus foot deformity      3.  Right chronic plantar fasciitis      4.  Right peroneus longus tenosynovitis   Procedure(s): 1.  Right lateralizing calcaneal osteotomy through a separate incision 2.  Right peroneus longus Tina lysis 3.  Deep transfer of right peroneus brevis to peroneus longus proximally and distally 4.  Right plantar fascial release through a separate incision 5.  Right first metatarsal dorsiflexion osteotomy 6.  Right foot AP, lateral and Harris heel radiographs  SURGEON:  Toni Arthurs, MD  ASSISTANT: Alfredo Martinez, PA-C  ANESTHESIA:   General, regional  EBL:  minimal   TOURNIQUET:   Total Tourniquet Time Documented: Thigh (Right) - 64 minutes Total: Thigh (Right) - 64 minutes  COMPLICATIONS:  None apparent  DISPOSITION:  Extubated, awake and stable to recovery.  INDICATION FOR PROCEDURE: The patient is a 57 year old female with a past medical history significant for chronic plantar fasciitis and a cavovarus foot deformity.  Recently she developed sudden increase in pain in the lateral aspect of her ankle and hindfoot.  She was found to have a peroneus brevis tendon tear.  She underwent treatment with physical therapy, bracing and steroid injection.  She has failed this treatment and presents now for surgery.  The risks and benefits of the alternative treatment options have been discussed in detail.  The patient wishes to proceed with surgery and specifically understands risks of bleeding, infection, nerve damage, blood clots, need for additional surgery, amputation and death.  PROCEDURE IN DETAIL:  After pre operative consent was obtained, and the correct operative site was identified,  the patient was brought to the operating room and placed supine on the OR table.  Anesthesia was administered.  Pre-operative antibiotics were administered.  A surgical timeout was taken.  Right lower extremity was prepped and draped in standard sterile fashion with a tourniquet around the thigh.  The extremity was exsanguinated and the tourniquet was inflated to 250 mmHg.  A short oblique incision was made over the lateral wall of the calcaneus.  Dissection was carried down through the subcutaneous tissues to the periosteum.  Periosteum was incised and elevated anteriorly and posteriorly.  Retractors were placed superiorly and inferiorly over the isthmus of the tuberosity.  A K wire was used to mark the level of the osteotomy and a lateral radiograph confirmed appropriate position.  The osteotomy was then made with the oscillating saw completed with an osteotome.  The calcaneal tuberosity was translated laterally and was fixed with 2 Biomet 4 mm partially threaded cannulated screws.  These were placed through a stab incision in the posterior aspect of the heel which was then irrigated and closed with nylon.  The lateral and Harris heel radiographs confirmed appropriate alignment of the osteotomy.  Hardware was appropriately positioned and of the appropriate lengths as well.  The lateral wound was irrigated after contouring the cut surface of bone appropriately.  The incision was closed with nylon.  Attention was then directed to the peroneal tendons.  A long curvilinear incision was made over the peroneals and dissection was carried down through the subcutaneous tissues to the  peroneal tendon sheath.  Sheath was incised and the tendons were exposed.  The superior peroneal retinaculum was taken down from the fibula in subperiosteal fashion.  The peroneus brevis was noted to be ruptured with extensive degenerative changes over the midportion spanning approximately 5 cm.  The peroneus longus was noted to have  extensive tenosynovitis but no evidence of tearing.  Careful Tina lysis was then performed along the length of the peroneus longus cleaning it of all synovitis with scissors.  The peroneus brevis was then debrided of all degenerated portion of the tendon.  There was insufficient tendon remaining to allow repair.  This necessitated deep transfer of the peroneus brevis to the peroneus longus.  The distal stump of the brevis was then transferred to the longus and was repaired with simple and horizontal mattress sutures of 0 Vicryl.  Proximally the stump of the brevis was repaired to the longus and wrapped around it with simple, horizontal mattress and figure-of-eight sutures.  The wound was then irrigated copiously.  The deep subtendinous tissues were approximated with Vicryl.  Superficial subcutaneous tissues were approximated with Monocryl, and the skin was closed with nylon.  Attention was then turned to the medial aspect of the foot where a longitudinal incision was made adjacent to the medial cord of the plantar fascia.  Dissection was carried superficial and plantar to the plantar fascia itself.  Adjacent soft tissues were protected while the plantar fascia was divided from medial to lateral under direct vision.  The wound was irrigated and closed with nylon.  Attention was turned to the dorsum of the midfoot where a longitudinal incision was made over the base of the first metatarsal.  Dissection was carried down through the subtenons tissues to the periosteum.  It was incised.  A small needle was placed in the first tarsometatarsal joint to mark the appropriate alignment of the screw.  A screw hole was then drilled at the base of the first metatarsal and a 2 hole one third tubular plate from the Biomet small frag set was inserted and rotated out of the way.  A closing wedge osteotomy was then cut with the oscillating saw removing a small dorsal wedge of bone.  The osteotomy was closed down and fixed with  the previously placed 2 hole plate.  Appropriate compression was noted at the osteotomy site.  Final AP, lateral and Harris heel radiographs showed appropriate position and length of all hardware and appropriate correction of the cavovarus foot deformity.  The dorsal wound was irrigated and closed with Monocryl and nylon.  Sterile dressings were applied followed by a well-padded short leg splint.  The tourniquet was released after application of the dressings.  The patient was awakened from anesthesia and transported to the recovery room in stable condition.   FOLLOW UP PLAN: The patient will be nonweightbearing on the right lower extremity for the next 6 weeks.  She will follow-up in 2 weeks for conversion from a splint to a cast and removal of her sutures.  Aspirin for DVT prophylaxis.   RADIOGRAPHS: AP, lateral and Harris heel radiographs of the right foot are obtained intraoperatively.  These show interval correction of the cavovarus foot deformities with first metatarsal and calcaneal osteotomies.  Hardware is appropriately positioned and of the appropriate lengths.  No other acute injuries are noted.    Alfredo Martinez PA-C was present and scrubbed for the duration of the operative case. His assistance assistance was essential in positioning the patient, prepping and draping,  gaining maintaining exposure, performing the operation, closing and dressing the wounds and applying the splint.

## 2018-01-01 NOTE — Anesthesia Postprocedure Evaluation (Signed)
Anesthesia Post Note  Patient: [name]  Procedure(s) Performed: Right Lateral Calcaneal Osteotomy (Right Foot) Right Peroneal Longus Tendon Debridement; Transfer of Peroneal Brevis to Longus Tendon (Right Ankle) Dorsiflexion Osteotomy of the First Metatarsal; Plantar Fascia Release (Right Foot)     Patient location during evaluation: PACU Anesthesia Type: General Level of consciousness: awake and alert Pain management: pain level controlled Vital Signs Assessment: post-procedure vital signs reviewed and stable Respiratory status: spontaneous breathing, nonlabored ventilation and respiratory function stable Cardiovascular status: blood pressure returned to baseline and stable Postop Assessment: no apparent nausea or vomiting Anesthetic complications: no    Last Vitals:  Vitals:   01/01/18 1230 01/01/18 1245  BP: 124/66 127/75  Pulse: (!) 103 (!) 113  Resp: 17 16  Temp:    SpO2: 98% 98%    Last Pain:  Vitals:   01/01/18 1245  TempSrc:   PainSc: 0-No pain                 [name]

## 2018-01-01 NOTE — Anesthesia Preprocedure Evaluation (Addendum)
Anesthesia Evaluation  Patient identified by MRN, date of birth, ID band Patient awake    Reviewed: Allergy & Precautions, NPO status , Patient's Chart, lab work & pertinent test results  Airway Mallampati: II  TM Distance: >3 FB Neck ROM: Full    Dental  (+) Teeth Intact, Dental Advisory Given   Pulmonary  Bronchitis    Pulmonary exam normal breath sounds clear to auscultation       Cardiovascular Exercise Tolerance: Good negative cardio ROS Normal cardiovascular exam Rhythm:Regular Rate:Normal     Neuro/Psych PSYCHIATRIC DISORDERS Anxiety negative neurological ROS     GI/Hepatic Neg liver ROS, GERD  Medicated and Poorly Controlled,  Endo/Other  negative endocrine ROS  Renal/GU negative Renal ROS     Musculoskeletal negative musculoskeletal ROS (+)   Abdominal   Peds  Hematology negative hematology ROS (+)   Anesthesia Other Findings Day of surgery medications reviewed with the patient.  Reproductive/Obstetrics                            Anesthesia Physical Anesthesia Plan  ASA: II  Anesthesia Plan: General   Post-op Pain Management:  Regional for Post-op pain   Induction: Intravenous  PONV Risk Score and Plan: 3 and Midazolam, Dexamethasone and Ondansetron  Airway Management Planned: Oral ETT  Additional Equipment:   Intra-op Plan:   Post-operative Plan: Extubation in OR  Informed Consent: I have reviewed the patients History and Physical, chart, labs and discussed the procedure including the risks, benefits and alternatives for the proposed anesthesia with the patient or authorized representative who has indicated his/her understanding and acceptance.   Dental advisory given  Plan Discussed with: CRNA  Anesthesia Plan Comments: (Risks/benefits of general anesthesia discussed with patient including risk of damage to teeth, lips, gum, and tongue, nausea/vomiting,  allergic reactions to medications, and the possibility of heart attack, stroke and death.  All patient questions answered.  Patient wishes to proceed.)       Anesthesia Quick Evaluation

## 2018-01-01 NOTE — H&P (Signed)
[name] is an 57 y.o. female.   Chief Complaint: Right foot pain HPI: The patient is a 57 year old female without significant past medical history.  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  She has failed nonoperative treatment to date including activity modification, oral anti-inflammatories, shoewear modifications and steroid injections.  She presents today for operative treatment of this painful deformity.  Past Medical History:  Diagnosis Date  . Allergic rhinitis   . Anxiety   . GERD (gastroesophageal reflux disease)   . IBS (irritable bowel syndrome)   . Insomnia   . Osteopenia   . Osteoporosis 2016   T score -2.6 DEXA 2011 with T score -2.6  . Plantar fasciitis     Past Surgical History:  Procedure Laterality Date  . APPENDECTOMY    . bladder distention  2009  . BREAST EXCISIONAL BIOPSY    . BREAST LUMPECTOMY     Benign-Bilateral  . KNEE SURGERY  04,05  . KNEE SURGERY  2016  . OOPHORECTOMY     laparoscopic BSO for benign cysts  . REFRACTIVE SURGERY  2016  . REPAIR OF RECTOCELE    . VAGINAL HYSTERECTOMY     early 2000's for uterine prolapse and rectocele    Family History  Problem Relation Age of Onset  . Hypertension Mother   . Diabetes Father   . Hypertension Father   . Heart disease Father   . Hypertension Sister   . Heart disease Paternal Uncle   . Diabetes Paternal Grandmother   . Heart disease Paternal Grandmother   . Diabetes Maternal Grandmother   . Heart disease Maternal Grandmother   . Diabetes Maternal Grandfather   . Heart disease Maternal Grandfather   . Diabetes Paternal Grandfather   . Heart disease Paternal Grandfather    Social History:  reports that  has never smoked. she has never used smokeless tobacco. She reports that she drinks alcohol. She reports that she does not use drugs.  Allergies:  Allergies  Allergen Reactions  . Cephalexin   . Decongestant [Pseudoephedrine Hcl Er]     Heart races  .  Erythromycin   . Neomycin-Bacitracin Zn-Polymyx     REACTION: RASH  . Petrol Dist-Piperonyl Butoxide-Pyrethrins [Pyrethrins-Piperonyl Butoxide]   . Promethazine Hcl   . Restasis [Cyclosporine]   . Sertraline Hcl   . Codeine Other (See Comments)    Makes pt agitated.    Medications Prior to Admission  Medication Sig Dispense Refill  . budesonide (RHINOCORT ALLERGY) 32 MCG/ACT nasal spray Place 2 sprays into both nostrils daily.    . cetirizine (ZYRTEC) 10 MG tablet     . docusate sodium (COLACE) 100 MG capsule Take 100 mg by mouth daily.    Marland Kitchen estradiol (VIVELLE-DOT) 0.05 MG/24HR patch     . famotidine (PEPCID) 20 MG tablet TK 1 T PO QD HS  2  . fluticasone furoate-vilanterol (BREO ELLIPTA) 100-25 MCG/INH AEPB Inhale 1 puff into the lungs daily.    Marland Kitchen LORazepam (ATIVAN) 1 MG tablet TK 1/2 TO 1 T PO BID PRN  3  . PROAIR HFA 108 (90 Base) MCG/ACT inhaler INL 2 PFS PO QID  0  . RABEprazole (ACIPHEX) 20 MG tablet Take 20 mg by mouth daily.    Marland Kitchen rOPINIRole (REQUIP) 1 MG tablet Take 1 tablet (1 mg total) by mouth 3 (three) times daily. 30 tablet 2  . topiramate (TOPAMAX) 25 MG capsule Take 25 mg by mouth 2 (two) times daily.    Marland Kitchen  venlafaxine XR (EFFEXOR-XR) 75 MG 24 hr capsule Take 75 mg by mouth daily with breakfast.      No results found for this or any previous visit (from the past 48 hour(s)). No results found.  ROS no recent fever, chills, nausea, vomiting or changes in her appetite.  Blood pressure 108/65, pulse 94, temperature 97.7 F (36.5 C), temperature source Oral, resp. rate 18, height 5\' 1"  (1.549 m), weight 76.1 kg (167 lb 12.8 oz), last menstrual period 12/09/1996, SpO2 100 %. Physical Exam  Well-nourished well-developed woman in no apparent distress.  Alert and oriented x4.  Mood and affect are normal.  Extra ocular motions are intact.  Respirations are unlabored.  Her gait is antalgic to the right.  The right foot has a cavovarus deformity with plantarflexed first ray.   Skin is healthy and intact.  No lymphadenopathy.  She is swollen laterally along the peroneal tendons.  Sural nerve sensibility to light touch is intact.  5 out of 5 strength in plantar flexion, dorsi flexion, inversion and eversion.  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  The risks and benefits of the alternative treatment options have been discussed in detail.  The patient wishes to proceed with surgery and specifically understands risks of bleeding, infection, nerve damage, blood clots, need for additional surgery, amputation and death.   Toni ArthursHEWITT, Yaw Escoto, MD 01/01/2018, 10:31 AM

## 2018-01-01 NOTE — Anesthesia Procedure Notes (Signed)
Anesthesia Regional Block: Popliteal block   Pre-Anesthetic Checklist: ,, timeout performed, Correct Patient, Correct Site, Correct Laterality, Correct Procedure, Correct Position, site marked, Risks and benefits discussed,  Surgical consent,  Pre-op evaluation,  At surgeon's request and post-op pain management  Laterality: Right  Prep: chloraprep       Needles:  Injection technique: Single-shot  Needle Type: Echogenic Needle     Needle Length: 9cm  Needle Gauge: 21     Additional Needles:   Procedures:,,,, ultrasound used (permanent image in chart),,,,  Narrative:  Start time: 01/01/2018 10:10 AM End time: 01/01/2018 10:17 AM Injection made incrementally with aspirations every 5 mL.  Performed by: Personally  Anesthesiologist: Cecile Hearingurk, Enas Winchel Edward, MD  Additional Notes: No pain on injection. No increased resistance to injection. Injection made in 5cc increments.  Good needle visualization.  Patient tolerated procedure well.  Right popliteal/saphenous nerve block.

## 2018-01-01 NOTE — Anesthesia Procedure Notes (Signed)
Procedure Name: Intubation Date/Time: 01/01/2018 10:35 AM Performed by: Signe Colt, CRNA Pre-anesthesia Checklist: Patient identified, Emergency Drugs available, Suction available and Patient being monitored Patient Re-evaluated:Patient Re-evaluated prior to induction Oxygen Delivery Method: Circle system utilized Preoxygenation: Pre-oxygenation with 100% oxygen Induction Type: IV induction Ventilation: Mask ventilation without difficulty Laryngoscope Size: Mac and 3 Grade View: Grade I Tube type: Oral Number of attempts: 1 Airway Equipment and Method: Stylet and Oral airway Placement Confirmation: ETT inserted through vocal cords under direct vision,  positive ETCO2 and breath sounds checked- equal and bilateral Tube secured with: Tape Dental Injury: Teeth and Oropharynx as per pre-operative assessment

## 2018-01-01 NOTE — Discharge Instructions (Addendum)
Pt took oxy at 1:15. Next dose due earliest 5:15 pm if needed     Toni Arthurs, MD [facility]  Please read the following information regarding your care after surgery.  Medications  You only need a prescription for the narcotic pain medicine (ex. oxycodone, Percocet, Norco).  All of the other medicines listed below are available over the counter. X Aleve 2 pills twice a day for the first 3 days after surgery. X acetominophen (Tylenol) 650 mg every 4-6 hours as you need for minor to moderate pain X oxycodone as prescribed for severe pain X zofran as prescribed for nausea  Narcotic pain medicine (ex. oxycodone, Percocet, Vicodin) will cause constipation.  To prevent this problem, take the following medicines while you are taking any pain medicine. X docusate sodium (Colace) 100 mg twice a day X senna (Senokot) 2 tablets twice a day  X To help prevent blood clots, take a baby aspirin (81 mg) twice a day after surgery.  You should also get up every hour while you are awake to move around.    Weight Bearing X Do not bear any weight on the operated leg or foot.  Cast / Splint / Dressing X Keep your splint, cast or dressing clean and dry.  Dont put anything (coat hanger, pencil, etc) down inside of it.  If it gets damp, use a hair dryer on the cool setting to dry it.  If it gets soaked, call the office to schedule an appointment for a cast change.  After your dressing, cast or splint is removed; you may shower, but do not soak or scrub the wound.  Allow the water to run over it, and then gently pat it dry.  Swelling It is normal for you to have swelling where you had surgery.  To reduce swelling and pain, keep your toes above your nose for at least 3 days after surgery.  It may be necessary to keep your foot or leg elevated for several weeks.  If it hurts, it should be elevated.  Follow Up Call my office at [phone] when you are discharged from the hospital or surgery  center to schedule an appointment to be seen two weeks after surgery.  Call my office at [phone] if you develop a fever >101.5 F, nausea, vomiting, bleeding from the surgical site or severe pain.      Post Anesthesia Home Care Instructions  Activity: Get plenty of rest for the remainder of the day. A responsible individual must stay with you for 24 hours following the procedure.  For the next 24 hours, DO NOT: -Drive a car -Advertising copywriter -Drink alcoholic beverages -Take any medication unless instructed by your physician -Make any legal decisions or sign important papers.  Meals: Start with liquid foods such as gelatin or soup. Progress to regular foods as tolerated. Avoid greasy, spicy, heavy foods. If nausea and/or vomiting occur, drink only clear liquids until the nausea and/or vomiting subsides. Call your physician if vomiting continues.  Special Instructions/Symptoms: Your throat may feel dry or sore from the anesthesia or the breathing tube placed in your throat during surgery. If this causes discomfort, gargle with warm salt water. The discomfort should disappear within 24 hours.  If you had a scopolamine patch placed behind your ear for the management of post- operative nausea and/or vomiting:  1. The medication in the patch is effective for 72 hours, after which it should be removed.  Wrap patch in a tissue and discard in the  trash. Wash hands thoroughly with soap and water. 2. You may remove the patch earlier than 72 hours if you experience unpleasant side effects which may include dry mouth, dizziness or visual disturbances. 3. Avoid touching the patch. Wash your hands with soap and water after contact with the patch.   Regional Anesthesia Blocks  1. Numbness or the inability to move the "blocked" extremity may last from 3-48 hours after placement. The length of time depends on the medication injected and your individual response to the medication. If the numbness  is not going away after 48 hours, call your surgeon.  2. The extremity that is blocked will need to be protected until the numbness is gone and the  Strength has returned. Because you cannot feel it, you will need to take extra care to avoid injury. Because it may be weak, you may have difficulty moving it or using it. You may not know what position it is in without looking at it while the block is in effect.  3. For blocks in the legs and feet, returning to weight bearing and walking needs to be done carefully. You will need to wait until the numbness is entirely gone and the strength has returned. You should be able to move your leg and foot normally before you try and bear weight or walk. You will need someone to be with you when you first try to ensure you do not fall and possibly risk injury.  4. Bruising and tenderness at the needle site are common side effects and will resolve in a few days.  5. Persistent numbness or new problems with movement should be communicated to the surgeon or the [facility] [phone])/ [facility] [phone]).

## 2018-01-02 ENCOUNTER — Encounter (HOSPITAL_BASED_OUTPATIENT_CLINIC_OR_DEPARTMENT_OTHER): Payer: Self-pay | Admitting: Orthopedic Surgery

## 2018-02-05 ENCOUNTER — Ambulatory Visit: Payer: 59 | Admitting: Neurology

## 2018-04-27 ENCOUNTER — Other Ambulatory Visit: Payer: Self-pay | Admitting: *Deleted

## 2018-04-27 DIAGNOSIS — M810 Age-related osteoporosis without current pathological fracture: Secondary | ICD-10-CM

## 2018-06-30 ENCOUNTER — Other Ambulatory Visit: Payer: Self-pay | Admitting: Gynecology

## 2018-07-01 NOTE — Telephone Encounter (Signed)
annual exam on 07/16/18

## 2018-07-16 ENCOUNTER — Encounter: Payer: Self-pay | Admitting: Gynecology

## 2018-07-16 ENCOUNTER — Ambulatory Visit (INDEPENDENT_AMBULATORY_CARE_PROVIDER_SITE_OTHER): Payer: 59 | Admitting: Gynecology

## 2018-07-16 VITALS — BP 124/80 | Ht 61.0 in | Wt 164.0 lb

## 2018-07-16 DIAGNOSIS — Z79890 Hormone replacement therapy (postmenopausal): Secondary | ICD-10-CM | POA: Diagnosis not present

## 2018-07-16 DIAGNOSIS — N952 Postmenopausal atrophic vaginitis: Secondary | ICD-10-CM | POA: Diagnosis not present

## 2018-07-16 DIAGNOSIS — M810 Age-related osteoporosis without current pathological fracture: Secondary | ICD-10-CM

## 2018-07-16 DIAGNOSIS — Z01419 Encounter for gynecological examination (general) (routine) without abnormal findings: Secondary | ICD-10-CM | POA: Diagnosis not present

## 2018-07-16 NOTE — Patient Instructions (Signed)
Follow-up for the bone density as scheduled. 

## 2018-07-16 NOTE — Progress Notes (Signed)
    [name] AprilChristy [name] 05/02/1961 [id number]        57 y.o.  [id number] for annual gynecologic exam.  Several issues noted below.  Past medical history,surgical history, problem list, medications, allergies, family history and social history were all reviewed and documented as reviewed in the EPIC chart.  ROS:  Performed with pertinent positives and negatives included in the history, assessment and plan.   Additional significant findings : None   Exam: Kennon PortelaKim Gardner assistant Vitals:   07/16/18 1041  BP: 124/80  Weight: 164 lb (74.4 kg)  Height: 5\' 1"  (1.549 m)   Body mass index is 30.99 kg/m.  General appearance:  Normal affect, orientation and appearance. Skin: Grossly normal HEENT: Without gross lesions.  No cervical or supraclavicular adenopathy. Thyroid normal.  Lungs:  Clear without wheezing, rales or rhonchi Cardiac: RR, without RMG Abdominal:  Soft, nontender, without masses, guarding, rebound, organomegaly or hernia Breasts:  Examined lying and sitting without masses, retractions, discharge or axillary adenopathy. Pelvic:  Ext, BUS, Vagina: With atrophic changes  Adnexa: Without masses or tenderness    Anus and perineum: Normal   Rectovaginal: Normal sphincter tone without palpated masses or tenderness.    Assessment/Plan:  57 y.o. [id number] female for annual gynecologic exam.  Status post TVH posterior repair with subsequent laparoscopic BSO for ovarian cysts.  1. Postmenopausal/HRT.  Was on Vivelle 0.5 mg patches.  She discontinued this on her own and for now is doing well without significant hot flushes and sweats.  We discussed the risks versus benefits of HRT.  Thrombosis and the issues of breast cancer versus potential benefits to include cardiovascular and bone health.  At this point the patient does not want to reinitiate but will monitor and follow-up with her any issues with this. 2. Osteoporosis.  DEXA 2016 T score -2.6.  Received Reclast x1 and Prolia x1 but had  unacceptable side effects.  Was to recheck bone density last year but this was not done.  Scheduled bone density now and she agrees to follow-up for this. 3. Colonoscopy scheduled and she will follow-up for this.  She does have some symptoms to suggest GERD and I asked her to call her gastroenterologist before hand to see if he would want to add endoscopy to the procedure and also to address her GERD. 4. Mammography coming due this fall and I reminded her to schedule it.  Breast exam normal today. 5. Pap smear 2016.  Pap smear done today.  No history of significant abnormal Pap smears.  Options to stop screening based on hysterectomy history per current screening guidelines reviewed.  Will readdress on an annual basis. 6. Health maintenance.  No routine lab work done as patient does this elsewhere.  Follow-up 1 year, sooner as needed.   Dara Lordsimothy P Alann Avey MD, 11:18 AM 07/16/2018

## 2018-07-16 NOTE — Addendum Note (Signed)
Addended by: Dayna BarkerGARDNER, KIMBERLY K on: 07/16/2018 11:31 AM   Modules accepted: Orders

## 2018-07-17 LAB — PAP IG W/ RFLX HPV ASCU

## 2018-07-23 ENCOUNTER — Encounter: Payer: Self-pay | Admitting: Gynecology

## 2018-08-04 ENCOUNTER — Emergency Department (HOSPITAL_COMMUNITY)
Admission: EM | Admit: 2018-08-04 | Discharge: 2018-08-04 | Disposition: A | Discharge status: Home / Self Care | Payer: 59 | Attending: Emergency Medicine | Admitting: Emergency Medicine

## 2018-08-04 ENCOUNTER — Emergency Department (HOSPITAL_COMMUNITY): Payer: 59

## 2018-08-04 ENCOUNTER — Other Ambulatory Visit: Payer: Self-pay

## 2018-08-04 ENCOUNTER — Encounter (HOSPITAL_COMMUNITY): Payer: Self-pay | Admitting: Emergency Medicine

## 2018-08-04 DIAGNOSIS — R05 Cough: Secondary | ICD-10-CM | POA: Diagnosis not present

## 2018-08-04 DIAGNOSIS — Z79899 Other long term (current) drug therapy: Secondary | ICD-10-CM | POA: Insufficient documentation

## 2018-08-04 DIAGNOSIS — R059 Cough, unspecified: Secondary | ICD-10-CM

## 2018-08-04 DIAGNOSIS — N3090 Cystitis, unspecified without hematuria: Secondary | ICD-10-CM

## 2018-08-04 DIAGNOSIS — R300 Dysuria: Secondary | ICD-10-CM | POA: Diagnosis not present

## 2018-08-04 DIAGNOSIS — N3000 Acute cystitis without hematuria: Secondary | ICD-10-CM | POA: Insufficient documentation

## 2018-08-04 LAB — URINALYSIS, ROUTINE W REFLEX MICROSCOPIC
BACTERIA UA: NONE SEEN
BILIRUBIN URINE: NEGATIVE
GLUCOSE, UA: NEGATIVE mg/dL
Ketones, ur: NEGATIVE mg/dL
NITRITE: NEGATIVE
PROTEIN: 30 mg/dL — AB
RBC / HPF: >50 RBC/hpf — ABNORMAL HIGH (ref 0–5)
Specific Gravity, Urine: 1.018 (ref 1.005–1.030)
pH: 5 (ref 5.0–8.0)

## 2018-08-04 MED ORDER — SULFAMETHOXAZOLE-TRIMETHOPRIM 800-160 MG PO TABS: 1.0000 | ORAL_TABLET | Freq: Two times a day (BID) | ORAL | 0 refills | Status: AC

## 2018-08-04 NOTE — ED Triage Notes (Signed)
Pt reports having vaginal pain for the last several days and has been having difficulty urinating. Pt also concerned with cough that has been ongoing for several days.

## 2018-08-04 NOTE — Discharge Instructions (Signed)
Your chest x-ray was reassuring, no pneumonia.  You have a urinary tract infection.  Please take antibiotic twice a day for the next 3 days.  Do not stop the antibiotic early.  Drink plenty of fluids.  You can take AZO over-the-counter pills to help with your symptoms, remember that this can make your urine orange.  You can also drink cranberry juice to help with your symptoms.  Return to the ER if you have any new or concerning symptoms like fever, vomiting, shortness of breath or chest pain.

## 2018-08-04 NOTE — ED Provider Notes (Signed)
[facility]-EMERGENCY DEPT Provider Note   CSN: [id number] Arrival date & time: 08/04/18  1191     History   Chief Complaint Chief Complaint  Patient presents with  . Vaginal Pain  . Cough    HPI [name] is a 57 y.o. female.  HPI   [name] is a 57 year old female with a history GERD, IBS, allergic rhinitis and osteoporosis who presents to the emergency department for evaluation of multiple complaints.  She reports that she has had a productive cough of yellow/green sputum for greater than a month.  She initially had a chest x-ray which showed bronchitis.  She reports that her symptoms have not improved despite taking over-the-counter cough medication.  According to her PCP, she was told that her cough is likely related to her GERD.  She denies associated shortness of breath, chest pain, fever/chills, sore throat, congestion, leg swelling.    She reports that she also has had dysuria, urinary frequency and urinary urgency for the past several days.  Believes that she may have a urinary tract infection.  She reports occasional suprapubic abdominal cramping which is mild in severity.  She reports that she is sexually active with one female partner and endorses vaginal dryness and dyspareunia.  She denies nausea/vomiting, flank pain, hematuria, vaginal discharge, vaginal bleeding, diarrhea.  Past Medical History:  Diagnosis Date  . Allergic rhinitis   . Anxiety   . GERD (gastroesophageal reflux disease)   . IBS (irritable bowel syndrome)   . Insomnia   . Osteopenia   . Osteoporosis 2016   T score -2.6 DEXA 2011 with T score -2.6  . Plantar fasciitis     Patient Active Problem List   Diagnosis Date Noted  . Hypersomnia, persistent 10/29/2017  . Vivid dream 10/29/2017  . RLS (restless legs syndrome) 10/29/2017  . Mixed obsessional thoughts and acts 10/29/2017  . Compulsive buying 10/29/2017  . Compulsive eating patterns 10/29/2017  . GERD  (gastroesophageal reflux disease) 08/24/2013  . Acute bronchitis 08/24/2013  . Osteoporosis   . Unspecified vitamin D deficiency   . ALLERGIC CONJUNCTIVITIS 05/05/2008  . NASAL POLYP 05/05/2008  . Seasonal allergic rhinitis 05/05/2008  . Pneumothorax 05/05/2008  . BREAST MASS, BENIGN 05/05/2008    Past Surgical History:  Procedure Laterality Date  . APPENDECTOMY    . bladder distention  2009  . BREAST EXCISIONAL BIOPSY    . BREAST LUMPECTOMY     Benign-Bilateral  . CALCANEAL OSTEOTOMY Right 01/01/2018   Procedure: Right Lateral Calcaneal Osteotomy;  Surgeon: Toni Arthurs, MD;  Location: [facility];  Service: Orthopedics;  Laterality: Right;  . KNEE SURGERY  04,05  . KNEE SURGERY  2016  . METATARSAL OSTEOTOMY Right 01/01/2018   Procedure: Dorsiflexion Osteotomy of the First Metatarsal; Plantar Fascia Release;  Surgeon: Toni Arthurs, MD;  Location: [facility];  Service: Orthopedics;  Laterality: Right;  . OOPHORECTOMY     laparoscopic BSO for benign cysts  . REFRACTIVE SURGERY  2016  . REPAIR OF RECTOCELE    . TENDON TRANSFER Right 01/01/2018   Procedure: Right Peroneus Longus Tendon Debridement and Tenolysis; Transfer of Peroneus Brevis to Peroneus Longus Tendon;  Surgeon: Toni Arthurs, MD;  Location:  SURGERY CENTER;  Service: Orthopedics;  Laterality: Right;  Marland Kitchen VAGINAL HYSTERECTOMY     early 2000's for uterine prolapse and rectocele     OB History    Gravida  3   Para  2   Term  2   Preterm      AB  1   Living  2     SAB      TAB      Ectopic      Multiple      Live Births               Home Medications    Prior to Admission medications   Medication Sig Start Date End Date Taking? Authorizing Provider  aspirin EC 81 MG tablet Take 1 tablet (81 mg total) by mouth 2 (two) times daily. Patient not taking: Reported on 07/16/2018 01/01/18   Jacinta Shoellis, Justin Pike, PA-C  budesonide [facility](RHINOCORT ALLERGY) 32 MCG/ACT nasal  spray Place 2 sprays into both nostrils daily.    [provider]  cetirizine (ZYRTEC) 10 MG tablet  09/08/17   [provider]  docusate sodium (COLACE) 100 MG capsule Take 1 capsule (100 mg total) by mouth 2 (two) times daily. While taking narcotic pain medicine. 01/01/18   Jacinta Shoellis, Justin Pike, PA-C  estradiol (VIVELLE-DOT) 0.05 MG/24HR patch  09/08/17   [provider]  famotidine (PEPCID) 20 MG tablet TK 1 T PO QD HS 08/18/17   [provider]  fluticasone furoate-vilanterol (BREO ELLIPTA) 100-25 MCG/INH AEPB Inhale 1 puff into the lungs daily.    [provider]  LORazepam (ATIVAN) 1 MG tablet TK 1/2 TO 1 T PO BID PRN 08/18/17   [provider]  PROAIR HFA 108 (90 Base) MCG/ACT inhaler INL 2 PFS PO QID 07/19/17   [provider]  RABEprazole (ACIPHEX) 20 MG tablet Take 20 mg by mouth daily.    [provider]  rOPINIRole (REQUIP) 1 MG tablet Take 1 tablet (1 mg total) by mouth 3 (three) times daily. Patient not taking: Reported on 07/16/2018 10/29/17   Dohmeier, Porfirio Mylararmen, MD  venlafaxine XR (EFFEXOR-XR) 75 MG 24 hr capsule Take 75 mg by mouth daily with breakfast.    [provider]    Family History Family History  Problem Relation Age of Onset  . Hypertension Mother   . Diabetes Father   . Hypertension Father   . Heart disease Father   . Cancer Father        Prostate  . Hypertension Sister   . Cancer Sister        Melanoma  . Heart disease Paternal Uncle   . Diabetes Paternal Grandmother   . Heart disease Paternal Grandmother   . Diabetes Maternal Grandmother   . Heart disease Maternal Grandmother   . Diabetes Maternal Grandfather   . Heart disease Maternal Grandfather   . Diabetes Paternal Grandfather   . Heart disease Paternal Grandfather     Social History Social History   Tobacco Use  . Smoking status: Never Smoker  . Smokeless tobacco: Never Used  Substance Use Topics  . Alcohol use: Yes     Alcohol/week: 0.0 standard drinks    Comment: rare  . Drug use: No     Allergies   Cephalexin; Decongestant [pseudoephedrine hcl er]; Erythromycin; Neomycin-bacitracin zn-polymyx; Petrol dist-piperonyl butoxide-pyrethrins [pyrethrins-piperonyl butoxide]; Promethazine hcl; Restasis [cyclosporine]; Sertraline hcl; and Codeine   Review of Systems Review of Systems  Constitutional: Negative for chills and fever.  HENT: Negative for congestion, sinus pressure, sinus pain and sore throat.   Respiratory: Positive for cough. Negative for shortness of breath and wheezing.   Cardiovascular: Negative for chest pain.  Gastrointestinal: Positive for abdominal pain (suprapubic). Negative for blood in stool, diarrhea,  nausea and vomiting.  Genitourinary: Positive for dyspareunia, dysuria and frequency. Negative for flank pain, hematuria, vaginal bleeding, vaginal discharge and vaginal pain.  Musculoskeletal: Negative for back pain and gait problem.  Neurological: Negative for weakness and headaches.  Hematological: Negative for adenopathy.  All other systems reviewed and are negative.    Physical Exam Updated Vital Signs BP (!) 149/95 (BP Location: Left Arm)   Pulse 92   Temp 97.7 F (36.5 C) (Oral)   Resp 14   Ht 5\' 1"  (1.549 m)   Wt 72.6 kg   LMP 12/09/1996   SpO2 96%   BMI 30.23 kg/m   Physical Exam  Constitutional: She is oriented to person, place, and time. She appears well-developed and well-nourished. No distress.  NAD.  HENT:  Head: Normocephalic and atraumatic.  Mouth/Throat: Oropharynx is clear and moist. No oropharyngeal exudate.  Eyes: Pupils are equal, round, and reactive to light. Conjunctivae are normal. Right eye exhibits no discharge. Left eye exhibits no discharge.  Neck: Normal range of motion. No JVD present.  Cardiovascular: Normal rate, regular rhythm and intact distal pulses.  Pulmonary/Chest: Effort normal and breath sounds normal. No stridor. No respiratory  distress. She has no wheezes. She has no rales.  Abdominal:  Abdomen soft and nondistended.  Nontender to palpation.  No CVA tenderness.  Musculoskeletal:  No leg swelling.  Neurological: She is alert and oriented to person, place, and time. Coordination normal.  Skin: Skin is warm and dry. She is not diaphoretic.  Psychiatric: She has a normal mood and affect. Her behavior is normal.  Nursing note and vitals reviewed.    ED Treatments / Results  Labs (all labs ordered are listed, but only abnormal results are displayed) Labs Reviewed  URINALYSIS, ROUTINE W REFLEX MICROSCOPIC - Abnormal; Notable for the following components:      Result Value   APPearance HAZY (*)    Hgb urine dipstick LARGE (*)    Protein, ur 30 (*)    Leukocytes, UA MODERATE (*)    RBC / HPF >50 (*)    All other components within normal limits    EKG None  Radiology Dg Chest 2 View  Result Date: 08/04/2018 CLINICAL DATA:  57 year old female with productive cough. EXAM: CHEST - 2 VIEW COMPARISON:  Chest radiograph dated 07/24/2018 FINDINGS: The heart size and mediastinal contours are within normal limits. Both lungs are clear. The visualized skeletal structures are unremarkable. IMPRESSION: No active cardiopulmonary disease. Electronically Signed   By: Elgie Collard M.D.   On: 08/04/2018 04:31    Procedures Procedures (including critical care time)  Medications Ordered in ED Medications - No data to display   Initial Impression / Assessment and Plan / ED Course  I have reviewed the triage vital signs and the nursing notes.  Pertinent labs & imaging results that were available during my care of the patient were reviewed by me and considered in my medical decision making (see chart for details).     Presents with several complaints  Cough Chest x-ray negative for acute abnormality, no pneumonia.  She is oxygenating well on RA. Vital signs stable.  She denies associated shortness of breath or  chest pain.  She does have a history of GERD, it is possible that her cough is related to this. I have counseled her to take pepcid and tums at home for this. I counseled her to follow up with PCP if her symptoms do not improve and also discussed reasons to return  to the ED immediately.   UTI Patient with dysuria, urinary frequency and many WBCs/RBCs. Presentation consistent with UTI. Pt is afebrile, no CVA tenderness, normotensive, and denies N/V. Pt to be dc home with antibiotics and instructions to follow up with PCP if symptoms persist.   Final Clinical Impressions(s) / ED Diagnoses   Final diagnoses:  Cystitis  Cough    ED Discharge Orders         Ordered    sulfamethoxazole-trimethoprim (BACTRIM DS,SEPTRA DS) 800-160 MG tablet  2 times daily     08/04/18 0529           Kellie Shropshire, PA-C 08/04/18 4098    Derwood Kaplan, MD 08/10/18 [phone]

## 2018-08-17 ENCOUNTER — Ambulatory Visit: Payer: 59 | Admitting: Internal Medicine

## 2018-09-09 ENCOUNTER — Other Ambulatory Visit: Payer: Self-pay | Admitting: Internal Medicine

## 2018-09-09 DIAGNOSIS — Z1231 Encounter for screening mammogram for malignant neoplasm of breast: Secondary | ICD-10-CM

## 2018-09-27 ENCOUNTER — Encounter (HOSPITAL_COMMUNITY): Payer: Self-pay | Admitting: Emergency Medicine

## 2018-09-27 ENCOUNTER — Emergency Department (HOSPITAL_COMMUNITY)
Admission: EM | Admit: 2018-09-27 | Discharge: 2018-09-27 | Disposition: A | Discharge status: Home / Self Care | Payer: 59 | Attending: Emergency Medicine | Admitting: Emergency Medicine

## 2018-09-27 ENCOUNTER — Emergency Department (HOSPITAL_COMMUNITY): Payer: 59

## 2018-09-27 ENCOUNTER — Other Ambulatory Visit: Payer: Self-pay

## 2018-09-27 DIAGNOSIS — R51 Headache: Secondary | ICD-10-CM | POA: Insufficient documentation

## 2018-09-27 DIAGNOSIS — Z79899 Other long term (current) drug therapy: Secondary | ICD-10-CM | POA: Diagnosis not present

## 2018-09-27 DIAGNOSIS — Z7982 Long term (current) use of aspirin: Secondary | ICD-10-CM | POA: Insufficient documentation

## 2018-09-27 DIAGNOSIS — R079 Chest pain, unspecified: Secondary | ICD-10-CM | POA: Diagnosis not present

## 2018-09-27 DIAGNOSIS — R519 Headache, unspecified: Secondary | ICD-10-CM

## 2018-09-27 LAB — BASIC METABOLIC PANEL
ANION GAP: 8 (ref 5–15)
BUN: 12 mg/dL (ref 6–20)
CALCIUM: 9.4 mg/dL (ref 8.9–10.3)
CO2: 22 mmol/L (ref 22–32)
CREATININE: 1.3 mg/dL — AB (ref 0.44–1.00)
Chloride: 107 mmol/L (ref 98–111)
GFR calc Af Amer: 52 mL/min — ABNORMAL LOW (ref >60)
GFR, EST NON AFRICAN AMERICAN: 45 mL/min — AB (ref >60)
Glucose, Bld: 94 mg/dL (ref 70–99)
Potassium: 3.6 mmol/L (ref 3.5–5.1)
Sodium: 137 mmol/L (ref 135–145)

## 2018-09-27 LAB — I-STAT TROPONIN, ED
Troponin i, poc: 0 ng/mL (ref 0.00–0.08)
Troponin i, poc: 0 ng/mL (ref 0.00–0.08)

## 2018-09-27 LAB — CBC
HCT: 47.3 % — ABNORMAL HIGH (ref 36.0–46.0)
Hemoglobin: 14.2 g/dL (ref 12.0–15.0)
MCH: 28.1 pg (ref 26.0–34.0)
MCHC: 30 g/dL (ref 30.0–36.0)
MCV: 93.5 fL (ref 80.0–100.0)
PLATELETS: 303 10*3/uL (ref 150–400)
RBC: 5.06 MIL/uL (ref 3.87–5.11)
RDW: 13.3 % (ref 11.5–15.5)
WBC: 11.5 10*3/uL — ABNORMAL HIGH (ref 4.0–10.5)
nRBC: 0 % (ref 0.0–0.2)

## 2018-09-27 LAB — I-STAT BETA HCG BLOOD, ED (MC, WL, AP ONLY): I-stat hCG, quantitative: 7.9 m[IU]/mL — ABNORMAL HIGH (ref <5)

## 2018-09-27 MED ORDER — KETOROLAC TROMETHAMINE 30 MG/ML IJ SOLN: 30.0000 mg | Freq: Once | INTRAMUSCULAR | Status: DC

## 2018-09-27 MED ORDER — HYDROCODONE-ACETAMINOPHEN 5-325 MG PO TABS: 1.0000 | ORAL_TABLET | Freq: Four times a day (QID) | ORAL | 0 refills | Status: DC | PRN

## 2018-09-27 MED ORDER — FENTANYL CITRATE (PF) 100 MCG/2ML IJ SOLN
50.0000 ug | Freq: Once | INTRAMUSCULAR | Status: AC
  Administered 2018-09-27: 50 ug via INTRAVENOUS
  Filled 2018-09-27: qty 2

## 2018-09-27 MED ORDER — HYDROXYZINE HCL 10 MG PO TABS
10.0000 mg | ORAL_TABLET | Freq: Once | ORAL | Status: AC
  Administered 2018-09-27: 10 mg via ORAL
  Filled 2018-09-27: qty 1

## 2018-09-27 MED ORDER — PREDNISONE 20 MG PO TABS: 40.0000 mg | ORAL_TABLET | Freq: Every day | ORAL | 0 refills | Status: AC

## 2018-09-27 MED ORDER — SODIUM CHLORIDE 0.9 % IV BOLUS
1000.0000 mL | Freq: Once | INTRAVENOUS | Status: AC
  Administered 2018-09-27: 1000 mL via INTRAVENOUS

## 2018-09-27 MED ORDER — ACYCLOVIR 400 MG PO TABS: 800.0000 mg | ORAL_TABLET | Freq: Every day | ORAL | 0 refills | Status: AC

## 2018-09-27 NOTE — ED Provider Notes (Signed)
[facility] EMERGENCY DEPARTMENT Provider Note   CSN: [id number] Arrival date & time: 09/27/18  1326     History   Chief Complaint Chief Complaint  Patient presents with  . Chest Pain    HPI [name] is a 57 y.o. female.  The history is provided by the patient. No language interpreter was used.     57 year old female with hx of GERD, anxiety, presenting c/o cp.  Patient report yesterday afternoon she developed discomfort to her forehead and noticed several times on her forehead that is very painful.  Follows with that she endorsed frontal headache, having pain in her bilateral jaw, along both sides of the neck, and stiffness sensation.  Last night she also developed midsternal chest pain which radiates up to her neck and jaw which has since resolved.  She complaining of bilateral ear pain.  States she does not feel well, and endorse some generalized fatigue.  She denies any associated fever chills, vision changes, nausea vomiting diarrhea and no recent URI symptoms.  Denies any dizziness or numbness.  Denies any focal weakness aside from team sensation to her tip of her fingers.  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  She denies any significant cardiac history she is not a smoker or drinker and denies any premature cardiac disease.  She does admits to having increasing stress at home more than usual.    Past Medical History:  Diagnosis Date  . Allergic rhinitis   . Anxiety   . GERD (gastroesophageal reflux disease)   . IBS (irritable bowel syndrome)   . Insomnia   . Osteopenia   . Osteoporosis 2016   T score -2.6 DEXA 2011 with T score -2.6  . Plantar fasciitis     Patient Active Problem List   Diagnosis Date Noted  . Hypersomnia, persistent 10/29/2017  . Vivid dream 10/29/2017  . RLS  (restless legs syndrome) 10/29/2017  . Mixed obsessional thoughts and acts 10/29/2017  . Compulsive buying 10/29/2017  . Compulsive eating patterns 10/29/2017  . GERD (gastroesophageal reflux disease) 08/24/2013  . Acute bronchitis 08/24/2013  . Osteoporosis   . Unspecified vitamin D deficiency   . ALLERGIC CONJUNCTIVITIS 05/05/2008  . NASAL POLYP 05/05/2008  . Seasonal allergic rhinitis 05/05/2008  . Pneumothorax 05/05/2008  . BREAST MASS, BENIGN 05/05/2008    Past Surgical History:  Procedure Laterality Date  . APPENDECTOMY    . bladder distention  2009  . BREAST EXCISIONAL BIOPSY    . BREAST LUMPECTOMY     Benign-Bilateral  . CALCANEAL OSTEOTOMY Right 01/01/2018   Procedure: Right Lateral Calcaneal Osteotomy;  Surgeon: Toni Arthurs, MD;  Location: [facility];  Service: Orthopedics;  Laterality: Right;  . KNEE SURGERY  04,05  . KNEE SURGERY  2016  . METATARSAL OSTEOTOMY Right 01/01/2018   Procedure: Dorsiflexion Osteotomy of the First Metatarsal; Plantar Fascia Release;  Surgeon: Toni Arthurs, MD;  Location: [facility];  Service: Orthopedics;  Laterality: Right;  . OOPHORECTOMY     laparoscopic BSO for benign cysts  . REFRACTIVE SURGERY  2016  . REPAIR OF RECTOCELE    . TENDON TRANSFER Right 01/01/2018   Procedure: Right Peroneus Longus Tendon Debridement and Tenolysis; Transfer of Peroneus Brevis to Peroneus Longus Tendon;  Surgeon: Toni Arthurs, MD;  Location: [facility];  Service: Orthopedics;  Laterality: Right;  Marland Kitchen VAGINAL HYSTERECTOMY     early 2000's for uterine prolapse and rectocele     OB History    Gravida  3   Para  2   Term  2   Preterm      AB  1   Living  2     SAB      TAB      Ectopic      Multiple      Live Births               Home Medications    Prior to Admission medications   Medication Sig Start Date End Date Taking? Authorizing Provider  aspirin EC 81 MG tablet Take 1 tablet (81  mg total) by mouth 2 (two) times daily. Patient not taking: Reported on 07/16/2018 01/01/18   Jacinta Shoe, PA-C  budesonide [facility] ALLERGY) 32 MCG/ACT nasal spray Place 2 sprays into both nostrils daily.    [provider]  cetirizine (ZYRTEC) 10 MG tablet  09/08/17   [provider]  docusate sodium (COLACE) 100 MG capsule Take 1 capsule (100 mg total) by mouth 2 (two) times daily. While taking narcotic pain medicine. 01/01/18   Jacinta Shoe, PA-C  estradiol (VIVELLE-DOT) 0.05 MG/24HR patch  09/08/17   [provider]  famotidine (PEPCID) 20 MG tablet TK 1 T PO QD HS 08/18/17   [provider]  fluticasone furoate-vilanterol (BREO ELLIPTA) 100-25 MCG/INH AEPB Inhale 1 puff into the lungs daily.    [provider]  LORazepam (ATIVAN) 1 MG tablet TK 1/2 TO 1 T PO BID PRN 08/18/17   [provider]  PROAIR HFA 108 (90 Base) MCG/ACT inhaler INL 2 PFS PO QID 07/19/17   [provider]  RABEprazole (ACIPHEX) 20 MG tablet Take 20 mg by mouth daily.    [provider]  rOPINIRole (REQUIP) 1 MG tablet Take 1 tablet (1 mg total) by mouth 3 (three) times daily. Patient not taking: Reported on 07/16/2018 10/29/17   Dohmeier, Porfirio Mylar, MD  venlafaxine XR (EFFEXOR-XR) 75 MG 24 hr capsule Take 75 mg by mouth daily with breakfast.    [provider]    Family History Family History  Problem Relation Age of Onset  . Hypertension Mother   . Diabetes Father   . Hypertension Father   . Heart disease Father   . Cancer Father        Prostate  . Hypertension Sister   . Cancer Sister        Melanoma  . Heart disease Paternal Uncle   . Diabetes Paternal Grandmother   . Heart disease Paternal Grandmother   . Diabetes Maternal Grandmother   . Heart disease Maternal Grandmother   . Diabetes Maternal Grandfather   . Heart disease Maternal Grandfather   . Diabetes Paternal Grandfather   . Heart disease Paternal Grandfather       Social History Social History   Tobacco Use  . Smoking status: Never Smoker  . Smokeless tobacco: Never Used  Substance Use Topics  . Alcohol use: Yes    Alcohol/week: 0.0 standard drinks    Comment: rare  . Drug use: No     Allergies   Cephalexin; Decongestant [pseudoephedrine hcl er]; Erythromycin; Neomycin-bacitracin zn-polymyx; Petrol dist-piperonyl butoxide-pyrethrins [pyrethrins-piperonyl butoxide]; Promethazine hcl; Restasis [cyclosporine]; Sertraline hcl; and Codeine   Review of Systems Review of Systems  All other systems reviewed and are negative.  Physical Exam Updated Vital Signs BP (!) 145/72   Pulse 97   Resp 15   LMP 12/09/1996   SpO2 100%   Physical Exam  Constitutional: She is oriented to person, place, and time. She appears well-developed and well-nourished. No distress.  Patient is well-appearing, moving about in bed without difficulty and nontoxic.  HENT:  Head: Normocephalic and atraumatic.  Mild tenderness to the palpation of her forehead without any obvious skin changes. Ears: Normal TMs bilaterally Nose: Normal nares Throat: Uvula midline no tonsillar enlargement or exudates, no trismus  Eyes: Pupils are equal, round, and reactive to light. Conjunctivae and EOM are normal.  Neck: Normal range of motion. Neck supple.  No nuchal rigidity  Cardiovascular: Normal rate and regular rhythm.  Pulmonary/Chest: Effort normal and breath sounds normal.  Abdominal: Soft. She exhibits no distension. There is no tenderness.  Musculoskeletal: Normal range of motion.  Neurological: She is alert and oriented to person, place, and time. She has normal strength. No cranial nerve deficit or sensory deficit. She displays a negative Romberg sign. GCS eye subscore is 4. GCS verbal subscore is 5. GCS motor subscore is 6.  Skin: No rash noted.  Psychiatric: She has a normal mood and affect.  Nursing note and vitals reviewed.    ED Treatments / Results   Labs (all labs ordered are listed, but only abnormal results are displayed) Labs Reviewed  BASIC METABOLIC PANEL - Abnormal; Notable for the following components:      Result Value   Creatinine, Ser 1.30 (*)    GFR calc non Af Amer 45 (*)    GFR calc Af Amer 52 (*)    All other components within normal limits  CBC - Abnormal; Notable for the following components:   WBC 11.5 (*)    HCT 47.3 (*)    All other components within normal limits  I-STAT BETA HCG BLOOD, ED ([facility], [facility], [facility] ONLY) - Abnormal; Notable for the following components:   I-stat hCG, quantitative 7.9 (*)    All other components within normal limits  I-STAT TROPONIN, ED    EKG EKG Interpretation  Date/Time:  Sunday September 27 2018 13:28:53 EDT Ventricular Rate:  93 PR Interval:  150 QRS Duration: 82 QT Interval:  366 QTC Calculation: 455 R Axis:   2 Text Interpretation:  Normal sinus rhythm with sinus arrhythmia Possible Left atrial enlargement Anterior infarct , age undetermined Abnormal ECG baseline wander in I, II Confirmed by Tilden Fossa [phone]) on 09/27/2018 2:20:25 PM   Radiology Dg Chest 2 View  Result Date: 09/27/2018 CLINICAL DATA:  Acute onset chest pain last night. EXAM: CHEST - 2 VIEW COMPARISON:  PA and lateral chest 08/04/2018. CT chest and single view of the chest 08/14/2017. FINDINGS: Small focus of linear atelectasis or scar periphery of the left lung base noted. Lungs otherwise clear. Heart size normal. No pneumothorax or pleural effusion. No acute or focal bony abnormality. IMPRESSION: Negative chest. Electronically Signed   By: Drusilla Kanner M.D.   On: 09/27/2018 14:17    Procedures Procedures (including critical care time)  Medications Ordered in ED Medications  hydrOXYzine (ATARAX/VISTARIL) tablet 10 mg (has no administration in time range)  fentaNYL (SUBLIMAZE) injection 50 mcg (has no administration in time range)  sodium chloride 0.9 % bolus 1,000 mL (0 mLs Intravenous Stopped  09/27/18 1533)     Initial Impression / Assessment and Plan / ED Course  I have reviewed the triage vital signs and the nursing notes.  Pertinent labs & imaging results that were available during my care of the patient were reviewed by me and considered in my medical decision making (see chart for details).     BP 115/61   Pulse 92   Resp (!) 23   LMP 12/09/1996   SpO2 99%    Final Clinical Impressions(s) / ED Diagnoses   Final diagnoses:  None    ED Discharge Orders    None     3:14 PM Patient with history of anxiety here with increasing stress and now having headache, facial pain and pain to her neck and jaw along with chest pain.  Her chest pain since subside but she still endorse facial pain.  Her forehead is tender to palpation without any obvious signs of infections significant overlying skin changes, ear nose and throat is unremarkable.  She has no nuchal rigidity concerning for meningitis, no acute onset thunderclap headache concerning for subarachnoid hemorrhage, no focal neuro deficit to suggest strokes or space-occupying lesion.  HEART score of 1, low risk of MACE.  Low risk of PE based of Wells criteria.   3:45 PM Pt sign out to Graciella Freer, PA-C who will obtain delta trop, and reassessment.     Fayrene Helper, PA-C 09/27/18 1546    Tilden Fossa, MD 09/28/18 [phone]

## 2018-09-27 NOTE — ED Triage Notes (Addendum)
Pt states she was woken up by chest pain last night that radiated into both sides of her neck and jaws, states pain is intermittent and sometimes her hands tingle. Pt denies sob. Also c/o swelling to her forehead that she states is "too painful to touch."

## 2018-09-27 NOTE — ED Provider Notes (Signed)
Care assumed from Fayrene Helper, PA-C at shift change with delta trop pending.   In brief, this patient is a 57 y.o. F with PMH/o anxiety who presents for evaluation of multiple complaints. Patient had some vague forehead discomfort that began last night as well as associated headache.  Patient also complained of some vague chest pain that began last night.  Per previous provider, she had some intermittent pain in her neck and jaw.  Chest pain has since resolved.  She still has mild headache today.  No flat red flags, neuro symptoms noted.  Previous provider noted tenderness to palpation of forehead with no overlying rash, deformity or crepitus.  Please see note from previous provider for full history/physical exam.  PLAN: Patient is pending delta troponin.  Delta is negative, can be discharged home with primary care follow-up.  Patient did have some headache initially and was given pain medication.  No concerning symptoms or factors of her headache.  No imaging ordered by previous provider.  MDM:  RN informed me that patient felt like she was having new bumps appear on her forehead.  I went to go evaluate patient and both patient and friend who was in the room with her showed me her forehead.  She states that it was more erythematous than normal and that she felt small bumps.  On my evaluation, she had some slight erythema but patient had been rubbing her forehead so unclear if this is from palpation of her forehead.  On my evaluation, showed no evidence of rash, palpable masses, vesicles that would be concerning for shingles.  Patient reports that she was still having a headache but she states that she feels like the pain is on the surface of her forehead rather than in the head itself.  Patient is very concerned that this could be shingles and wants to know if that is her diagnosis.  I discussed with patient that I do not see any vesicles that would be concerning for shingles at this time.  I did explain to  patient that this could be an early presentation of shingles on the V1 dermatome distribution.  I offered to do a CT head for evaluation given her headache.  Patient declined CT head here in the ED.  I discussed treatment options with patient.  I explained that while I do not see any evidence of shingles on my exam today, could you start developing a rash that would be concerning for shingles.  Offered to send her home with treatments for shingles and that she can start taking if she develops any rash to the forehead.  Additionally, I explained that she would need to follow-up with her primary care doctor.  I once again offered a CT head for further evaluation but patient declined.  She states that she will wait for her delta troponin and wants to leave.  Delta troponin negative. Patient had ample opportunity for questions and discussion. All patient's questions were answered with full understanding. Strict return precautions discussed. Patient expresses understanding and agreement to plan.   1. Nonspecific chest pain   2. Acute nonintractable headache, unspecified headache type        Maxwell Caul, PA-C 09/27/18 1903    Melene Plan, DO 09/28/18 0002

## 2018-09-27 NOTE — ED Notes (Signed)
Patient verbalizes understanding of discharge instructions. Opportunity for questioning and answers were provided. Armband removed by staff, pt discharged from ED.  

## 2018-09-27 NOTE — Discharge Instructions (Signed)
As we discussed, today your exam does not look concerning for shingles.  If you do progressed into a rash on her forehead, I have provided you with the medications that you begin taking.  Additionally, you will need to follow-up with your primary doctor this week.  Only take the medications if you start breaking out into a rash.  Do not drive while taking the pain medication as will make you drowsy.  Return to emergency department for any worsening headache, fever, numbness/weakness of your arms or legs, nausea/vomiting, vision changes, chest pain, difficulty breathing or any other worsening or concerning symptoms.

## 2018-10-06 ENCOUNTER — Other Ambulatory Visit: Payer: Self-pay

## 2018-10-06 ENCOUNTER — Encounter (HOSPITAL_BASED_OUTPATIENT_CLINIC_OR_DEPARTMENT_OTHER): Payer: Self-pay | Admitting: *Deleted

## 2018-10-06 ENCOUNTER — Other Ambulatory Visit: Payer: Self-pay | Admitting: Surgery

## 2018-10-06 NOTE — Progress Notes (Signed)
Patient given given Ensure pre-surgery with instructions to drink by 1030, voiced understanding.

## 2018-10-07 NOTE — H&P (Signed)
[name] Documented: 10/06/2018 10:23 AM Location: [facility] Patient #: [id number] DOB: [birth year] Married / Language: [name] / Race: White Female   History of Present Illness ([name] A. Magnus Ivan MD; 10/06/2018 10:43 AM) The patient is a 57 year old female who presents for a preoperative evaluation. This patient is referred by Dr. Marden Noble for possible temporal arteritis. She is recently been having left-sided headaches with forehead discomfort and visual disturbance the left side. Her sedimentation rate was normal but her CRP was elevated. Interestingly, her mother had a history of temporal arteritis confirmed on biopsy. She is otherwise been without complaints. She is currently on steroids. She is still having headaches.   Past Surgical History ([name], RMA; 10/06/2018 10:23 AM) Appendectomy  Breast Biopsy  Bilateral. Foot Surgery  Right. Hysterectomy (not due to cancer) - Partial  Knee Surgery  Bilateral.  Diagnostic Studies History ([name], RMA; 10/06/2018 10:23 AM) Colonoscopy  within last year Mammogram  1-3 years ago Pap Smear  1-5 years ago  Allergies ([name], RMA; 10/06/2018 10:27 AM) Neosporin *OPHTHALMIC AGENTS*  Rash. Adhesive Tape  Rash. Allergies Reconciled   Medication History ([name], RMA; 10/06/2018 10:32 AM) predniSONE (10MG  Tablet, Oral three times daily) Active. Cetirizine HCl (10MG  Tablet, Oral) Active. Effexor XR (75MG  Capsule ER 24HR, Oral) Active. Flonase (50MCG/DOSE Inhaler, Nasal) Active. LORazepam (1MG  Tablet, Oral) Active. Mirapex (1.5MG  Tablet, Oral) Active. Phentermine HCl (15MG  Capsule, Oral) Active. RABEprazole Sodium (20MG  Tablet DR, Oral) Active. Topiramate (25MG  Tablet, Oral) Active. Breo Ellipta (100-25MCG/INH Aero Pow Br Act, Inhalation) Active. Medications Reconciled  Social History ([name], RMA; 10/06/2018 10:23 AM) Alcohol use   Occasional alcohol use. Caffeine use  Carbonated beverages, Coffee, Tea. No drug use  Tobacco use  Never smoker.  Family History ([name], RMA; 10/06/2018 10:23 AM) Alcohol Abuse  Brother. Arthritis  Mother. Depression  Brother, Sister. Heart Disease  Mother. Hypertension  Mother, Sister. Melanoma  Sister. Prostate Cancer  Father.  Pregnancy / Birth History ([name], RMA; 10/06/2018 10:23 AM) Age at menarche  13 years. Age of menopause  <45 Gravida  3 Irregular periods  Length (months) of breastfeeding  3-6 Maternal age  49-25 Para  2  Other Problems ([name], RMA; 10/06/2018 10:23 AM) Anxiety Disorder  Arthritis  Gastroesophageal Reflux Disease  Hemorrhoids  Lump In Breast  Oophorectomy     Review of Systems ([name] A. Brown RMA; 10/06/2018 10:23 AM) General Present- Chills, Fatigue and Fever. Not Present- Appetite Loss, Night Sweats, Weight Gain and Weight Loss. Skin Not Present- Change in Wart/Mole, Dryness, Hives, Jaundice, New Lesions, Non-Healing Wounds, Rash and Ulcer. HEENT Present- Earache and Ringing in the Ears. Not Present- Hearing Loss, Hoarseness, Nose Bleed, Oral Ulcers, Seasonal Allergies, Sinus Pain, Sore Throat, Visual Disturbances, Wears glasses/contact lenses and Yellow Eyes. Respiratory Not Present- Bloody sputum, Chronic Cough, Difficulty Breathing, Snoring and Wheezing. Breast Not Present- Breast Mass, Breast Pain, Nipple Discharge and Skin Changes. Cardiovascular Not Present- Chest Pain, Difficulty Breathing Lying Down, Leg Cramps, Palpitations, Rapid Heart Rate, Shortness of Breath and Swelling of Extremities. Gastrointestinal Not Present- Abdominal Pain, Bloating, Bloody Stool, Change in Bowel Habits, Chronic diarrhea, Constipation, Difficulty Swallowing, Excessive gas, Gets full quickly at meals, Hemorrhoids, Indigestion, Nausea, Rectal Pain and Vomiting. Female Genitourinary Not Present- Frequency,  Nocturia, Painful Urination, Pelvic Pain and Urgency. Musculoskeletal Present- Joint Pain. Not Present- Back Pain, Joint Stiffness, Muscle Pain, Muscle Weakness and Swelling of Extremities. Neurological Present-  Headaches. Not Present- Decreased Memory, Fainting, Numbness, Seizures, Tingling, Tremor, Trouble walking and Weakness. Endocrine Not Present- Cold Intolerance, Excessive Hunger, Hair Changes, Heat Intolerance, Hot flashes and New Diabetes. Hematology Not Present- Blood Thinners, Easy Bruising, Excessive bleeding, Gland problems, HIV and Persistent Infections.  Vitals ([name] A. Brown RMA; 10/06/2018 10:25 AM) 10/06/2018 10:24 AM Weight: 153.4 lb Height: 61in Body Surface Area: 1.69 m Body Mass Index: 28.98 kg/m  Temp.: 96.55F  Pulse: 115 (Regular)  BP: 120/78 (Sitting, Left Arm, Standard)       Physical Exam ([name] A. Magnus Ivan MD; 10/06/2018 10:44 AM) The physical exam findings are as follows: Note:Generally, she is well parents She has easily palpable temporal arteries Lungs are clear bilaterally Re: vascular is regular rate and rhythm  I reviewed her notes from Dr. Kevan Ny    Assessment & Plan [name] A. 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 I discussed the surgical procedure with her in detail and the readings for surgery. We discussed surgery risks as well. She understands and surgery will be scheduled urgently.

## 2018-10-08 ENCOUNTER — Encounter (HOSPITAL_BASED_OUTPATIENT_CLINIC_OR_DEPARTMENT_OTHER): Payer: Self-pay

## 2018-10-08 ENCOUNTER — Ambulatory Visit (HOSPITAL_BASED_OUTPATIENT_CLINIC_OR_DEPARTMENT_OTHER): Payer: 59 | Admitting: Anesthesiology

## 2018-10-08 ENCOUNTER — Encounter (HOSPITAL_BASED_OUTPATIENT_CLINIC_OR_DEPARTMENT_OTHER)
Admission: RE | Disposition: A | Discharge status: Home / Self Care | Payer: Self-pay | Source: Ambulatory Visit | Attending: Surgery

## 2018-10-08 ENCOUNTER — Ambulatory Visit (HOSPITAL_BASED_OUTPATIENT_CLINIC_OR_DEPARTMENT_OTHER)
Admission: RE | Admit: 2018-10-08 | Discharge: 2018-10-08 | Disposition: A | Discharge status: Home / Self Care | Payer: 59 | Source: Ambulatory Visit | Attending: Surgery | Admitting: Surgery

## 2018-10-08 ENCOUNTER — Other Ambulatory Visit: Payer: Self-pay

## 2018-10-08 DIAGNOSIS — J45909 Unspecified asthma, uncomplicated: Secondary | ICD-10-CM | POA: Diagnosis not present

## 2018-10-08 DIAGNOSIS — M316 Other giant cell arteritis: Secondary | ICD-10-CM | POA: Insufficient documentation

## 2018-10-08 DIAGNOSIS — K219 Gastro-esophageal reflux disease without esophagitis: Secondary | ICD-10-CM | POA: Insufficient documentation

## 2018-10-08 DIAGNOSIS — Z79899 Other long term (current) drug therapy: Secondary | ICD-10-CM | POA: Insufficient documentation

## 2018-10-08 HISTORY — PX: ARTERY BIOPSY: SHX891

## 2018-10-08 HISTORY — DX: Unspecified osteoarthritis, unspecified site: M19.90

## 2018-10-08 HISTORY — DX: Unspecified asthma, uncomplicated: J45.909

## 2018-10-08 SURGERY — BIOPSY TEMPORAL ARTERY
Anesthesia: Monitor Anesthesia Care | Site: Face | Laterality: Left

## 2018-10-08 MED ORDER — GABAPENTIN 300 MG PO CAPS: 300.0000 mg | ORAL_CAPSULE | ORAL | Status: DC

## 2018-10-08 MED ORDER — CHLORHEXIDINE GLUCONATE CLOTH 2 % EX PADS: 6.0000 | MEDICATED_PAD | Freq: Once | CUTANEOUS | Status: DC

## 2018-10-08 MED ORDER — CIPROFLOXACIN IN D5W 400 MG/200ML IV SOLN
400.0000 mg | INTRAVENOUS | Status: AC
  Administered 2018-10-08: 400 mg via INTRAVENOUS

## 2018-10-08 MED ORDER — ONDANSETRON HCL 4 MG/2ML IJ SOLN
INTRAMUSCULAR | Status: DC | PRN
  Administered 2018-10-08: 4 mg via INTRAVENOUS

## 2018-10-08 MED ORDER — CELECOXIB 200 MG PO CAPS: 200.0000 mg | ORAL_CAPSULE | ORAL | Status: DC

## 2018-10-08 MED ORDER — SCOPOLAMINE 1 MG/3DAYS TD PT72: 1.0000 | MEDICATED_PATCH | Freq: Once | TRANSDERMAL | Status: DC | PRN

## 2018-10-08 MED ORDER — GABAPENTIN 300 MG PO CAPS
ORAL_CAPSULE | ORAL | Status: AC
  Filled 2018-10-08: qty 1

## 2018-10-08 MED ORDER — ACETAMINOPHEN 500 MG PO TABS: 1000.0000 mg | ORAL_TABLET | ORAL | Status: DC

## 2018-10-08 MED ORDER — METOCLOPRAMIDE HCL 5 MG/ML IJ SOLN: 10.0000 mg | Freq: Once | INTRAMUSCULAR | Status: DC | PRN

## 2018-10-08 MED ORDER — EPHEDRINE SULFATE 50 MG/ML IJ SOLN
INTRAMUSCULAR | Status: DC | PRN
  Administered 2018-10-08 (×2): 10 mg via INTRAVENOUS

## 2018-10-08 MED ORDER — FENTANYL CITRATE (PF) 100 MCG/2ML IJ SOLN
INTRAMUSCULAR | Status: AC
  Filled 2018-10-08: qty 2

## 2018-10-08 MED ORDER — MEPERIDINE HCL 25 MG/ML IJ SOLN: 6.2500 mg | INTRAMUSCULAR | Status: DC | PRN

## 2018-10-08 MED ORDER — LIDOCAINE HCL (PF) 1 % IJ SOLN
INTRAMUSCULAR | Status: DC | PRN
  Administered 2018-10-08: 5 mL

## 2018-10-08 MED ORDER — OXYCODONE HCL 5 MG PO TABS: 5.0000 mg | ORAL_TABLET | Freq: Four times a day (QID) | ORAL | 0 refills | Status: DC | PRN

## 2018-10-08 MED ORDER — MIDAZOLAM HCL 2 MG/2ML IJ SOLN
1.0000 mg | INTRAMUSCULAR | Status: DC | PRN
  Administered 2018-10-08: 2 mg via INTRAVENOUS

## 2018-10-08 MED ORDER — LIDOCAINE HCL (PF) 1 % IJ SOLN
INTRAMUSCULAR | Status: AC
  Filled 2018-10-08: qty 30

## 2018-10-08 MED ORDER — FENTANYL CITRATE (PF) 100 MCG/2ML IJ SOLN: 25.0000 ug | INTRAMUSCULAR | Status: DC | PRN

## 2018-10-08 MED ORDER — CELECOXIB 200 MG PO CAPS
ORAL_CAPSULE | ORAL | Status: AC
  Filled 2018-10-08: qty 1

## 2018-10-08 MED ORDER — OXYCODONE HCL 5 MG PO TABS
5.0000 mg | ORAL_TABLET | Freq: Once | ORAL | Status: AC
  Administered 2018-10-08: 5 mg via ORAL

## 2018-10-08 MED ORDER — CIPROFLOXACIN IN D5W 400 MG/200ML IV SOLN
INTRAVENOUS | Status: AC
  Filled 2018-10-08: qty 200

## 2018-10-08 MED ORDER — MIDAZOLAM HCL 2 MG/2ML IJ SOLN
INTRAMUSCULAR | Status: AC
  Filled 2018-10-08: qty 2

## 2018-10-08 MED ORDER — LACTATED RINGERS IV SOLN
INTRAVENOUS | Status: DC
  Administered 2018-10-08: 13:00:00 via INTRAVENOUS

## 2018-10-08 MED ORDER — OXYCODONE HCL 5 MG PO TABS
ORAL_TABLET | ORAL | Status: AC
  Filled 2018-10-08: qty 1

## 2018-10-08 MED ORDER — FENTANYL CITRATE (PF) 100 MCG/2ML IJ SOLN
50.0000 ug | INTRAMUSCULAR | Status: DC | PRN
  Administered 2018-10-08: 50 ug via INTRAVENOUS

## 2018-10-08 MED ORDER — ACETAMINOPHEN 500 MG PO TABS
ORAL_TABLET | ORAL | Status: AC
  Filled 2018-10-08: qty 2

## 2018-10-08 MED ORDER — LIDOCAINE HCL (CARDIAC) PF 100 MG/5ML IV SOSY
PREFILLED_SYRINGE | INTRAVENOUS | Status: DC | PRN
  Administered 2018-10-08: 60 mg via INTRAVENOUS

## 2018-10-08 MED ORDER — LACTATED RINGERS IV SOLN: INTRAVENOUS | Status: DC

## 2018-10-08 MED ORDER — PROPOFOL 500 MG/50ML IV EMUL
INTRAVENOUS | Status: DC | PRN
  Administered 2018-10-08: 75 ug via INTRAVENOUS

## 2018-10-08 SURGICAL SUPPLY — 40 items
ADH SKN CLS APL DERMABOND .7 (GAUZE/BANDAGES/DRESSINGS) ×1
BLADE CLIPPER SENSICLIP SURGIC (BLADE) ×3 IMPLANT
BLADE SURG 15 STRL LF DISP TIS (BLADE) ×1 IMPLANT
BLADE SURG 15 STRL SS (BLADE) ×3
CANISTER SUCT 1200ML W/VALVE (MISCELLANEOUS) IMPLANT
COVER BACK TABLE 60X90IN (DRAPES) ×3 IMPLANT
COVER MAYO STAND STRL (DRAPES) ×3 IMPLANT
COVER WAND RF STERILE (DRAPES) IMPLANT
DECANTER SPIKE VIAL GLASS SM (MISCELLANEOUS) IMPLANT
DERMABOND ADVANCED (GAUZE/BANDAGES/DRESSINGS) ×2
DERMABOND ADVANCED .7 DNX12 (GAUZE/BANDAGES/DRESSINGS) ×2 IMPLANT
DRAPE LAPAROTOMY 100X72 PEDS (DRAPES) ×3 IMPLANT
DRAPE UTILITY XL STRL (DRAPES) ×3 IMPLANT
ELECT REM PT RETURN 9FT ADLT (ELECTROSURGICAL) ×3
ELECTRODE REM PT RTRN 9FT ADLT (ELECTROSURGICAL) ×1 IMPLANT
GLOVE SURG SIGNA 7.5 PF LTX (GLOVE) ×3 IMPLANT
GOWN STRL REUS W/ TWL LRG LVL3 (GOWN DISPOSABLE) ×1 IMPLANT
GOWN STRL REUS W/ TWL XL LVL3 (GOWN DISPOSABLE) ×1 IMPLANT
GOWN STRL REUS W/TWL LRG LVL3 (GOWN DISPOSABLE) ×3
GOWN STRL REUS W/TWL XL LVL3 (GOWN DISPOSABLE) ×3
NDL HYPO 25X1 1.5 SAFETY (NEEDLE) ×1 IMPLANT
NEEDLE HYPO 25X1 1.5 SAFETY (NEEDLE) ×3 IMPLANT
NS IRRIG 1000ML POUR BTL (IV SOLUTION) ×3 IMPLANT
PACK BASIN DAY SURGERY FS (CUSTOM PROCEDURE TRAY) ×3 IMPLANT
PENCIL BUTTON HOLSTER BLD 10FT (ELECTRODE) ×3 IMPLANT
SLEEVE SCD COMPRESS KNEE MED (MISCELLANEOUS) ×2 IMPLANT
SUT MNCRL AB 4-0 PS2 18 (SUTURE) ×1 IMPLANT
SUT MON AB 4-0 PC3 18 (SUTURE) ×2 IMPLANT
SUT SILK 3 0 SH 30 (SUTURE) ×2 IMPLANT
SUT SILK 3 0 TIES 17X18 (SUTURE) ×3
SUT SILK 3-0 18XBRD TIE BLK (SUTURE) ×1 IMPLANT
SUT VIC AB 3-0 SH 27 (SUTURE) ×3
SUT VIC AB 3-0 SH 27X BRD (SUTURE) ×1 IMPLANT
SYR CONTROL 10ML LL (SYRINGE) ×3 IMPLANT
TOWEL GREEN STERILE FF (TOWEL DISPOSABLE) ×3 IMPLANT
TOWEL OR NON WOVEN STRL DISP B (DISPOSABLE) ×1 IMPLANT
TRAY DSU PREP LF (CUSTOM PROCEDURE TRAY) ×3 IMPLANT
TUBE CONNECTING 20'X1/4 (TUBING)
TUBE CONNECTING 20X1/4 (TUBING) IMPLANT
YANKAUER SUCT BULB TIP NO VENT (SUCTIONS) IMPLANT

## 2018-10-08 NOTE — Anesthesia Preprocedure Evaluation (Signed)
Anesthesia Evaluation  Patient identified by MRN, date of birth, ID band Patient awake    Reviewed: Allergy & Precautions, NPO status , Patient's Chart, lab work & pertinent test results  Airway Mallampati: II  TM Distance: >3 FB Neck ROM: Full    Dental no notable dental hx.    Pulmonary asthma ,    Pulmonary exam normal breath sounds clear to auscultation       Cardiovascular negative cardio ROS Normal cardiovascular exam Rhythm:Regular Rate:Normal     Neuro/Psych negative neurological ROS  negative psych ROS   GI/Hepatic Neg liver ROS, GERD  Medicated and Controlled,  Endo/Other  negative endocrine ROS  Renal/GU negative Renal ROS  negative genitourinary   Musculoskeletal negative musculoskeletal ROS (+)   Abdominal   Peds negative pediatric ROS (+)  Hematology negative hematology ROS (+)   Anesthesia Other Findings   Reproductive/Obstetrics negative OB ROS                             Anesthesia Physical Anesthesia Plan  ASA: II  Anesthesia Plan: MAC   Post-op Pain Management:    Induction:   PONV Risk Score and Plan: 2 and Treatment may vary due to age or medical condition  Airway Management Planned: Nasal Cannula  Additional Equipment:   Intra-op Plan:   Post-operative Plan:   Informed Consent: I have reviewed the patients History and Physical, chart, labs and discussed the procedure including the risks, benefits and alternatives for the proposed anesthesia with the patient or authorized representative who has indicated his/her understanding and acceptance.   Dental advisory given  Plan Discussed with:   Anesthesia Plan Comments:         Anesthesia Quick Evaluation

## 2018-10-08 NOTE — Anesthesia Postprocedure Evaluation (Signed)
Anesthesia Post Note  Patient: [name]  Procedure(s) Performed: BIOPSY LEFT  TEMPORAL ARTERY (Left Face)     Patient location during evaluation: PACU Anesthesia Type: MAC Level of consciousness: awake and alert Pain management: pain level controlled Vital Signs Assessment: post-procedure vital signs reviewed and stable Respiratory status: spontaneous breathing, nonlabored ventilation, respiratory function stable and patient connected to nasal cannula oxygen Cardiovascular status: stable and blood pressure returned to baseline Postop Assessment: no apparent nausea or vomiting Anesthetic complications: no    Last Vitals:  Vitals:   10/08/18 1500 10/08/18 1512  BP: 101/62 108/65  Pulse: 85 82  Resp: 17 16  Temp:  36.5 C  SpO2: 98% 98%    Last Pain:  Vitals:   10/08/18 1512  TempSrc: Oral  PainSc: 3                  Montez Hageman

## 2018-10-08 NOTE — Op Note (Addendum)
BIOPSY LEFT  TEMPORAL ARTERY  Procedure Note  [name] 10/08/2018   Pre-op Diagnosis: temporal arteritis     Post-op Diagnosis: same  Procedure(s): BIOPSY LEFT  TEMPORAL ARTERY  Surgeon(s): Abigail Miyamoto, MD  Anesthesia: Monitor Anesthesia Care  Staff:  Circulator: Sondra Barges, RN Scrub Person: Burroughs, Larry Sierras, RN Circulator Assistant: Randalyn Rhea, RN  Estimated Blood Loss: Minimal               Specimens: sent to path  Procedure: The patient was brought to the operating room and identified as the correct patient.  She is placed upon the operating table and anesthesia was induced.  The left side of her face was then prepped and draped in usual sterile fashion after shaving some of her hair from the ear.  I anesthetized skin with lidocaine and made an incision in front of the ear.  I did use down through the fascia with electrocautery.  I then identified what appeared to be the temporal artery.  I clamped approximately distally and transected it with scissors and sent what appeared to be 2 sections to pathology.  I then tied off the ends of the vessel with 3-0 sutures and a 3-0 suture ligature proximally.  Hemostasis appeared to be achieved.  I then closed the subtenons tissue with interrupted 3-0 Vicryl sutures and closed skin with a running 4-0 Monocryl.  Dermabond was then applied.  The patient tolerated procedure well.  All the counts were correct at the end of the procedure.  The patient was then given a stable condition from the operating room to the recovery room.        Rasheda Ledger A   Date: 10/08/2018  Time: 2:26 PM

## 2018-10-08 NOTE — Transfer of Care (Signed)
Immediate Anesthesia Transfer of Care Note  Patient: Liviana Mills  Procedure(s) Performed: BIOPSY LEFT  TEMPORAL ARTERY (Left Face)  Patient Location: PACU  Anesthesia Type:MAC  Level of Consciousness: awake, alert , oriented and patient cooperative  Airway & Oxygen Therapy: Patient Spontanous Breathing and Patient connected to face mask oxygen  Post-op Assessment: Report given to RN and Post -op Vital signs reviewed and stable  Post vital signs: Reviewed and stable  Last Vitals:  Vitals Value Taken Time  BP    Temp    Pulse 89 10/08/2018  2:31 PM  Resp    SpO2 100 % 10/08/2018  2:31 PM  Vitals shown include unvalidated device data.  Last Pain:  Vitals:   10/08/18 1309  TempSrc: Oral  PainSc: 3       Patients Stated Pain Goal: 4 (33/29/51 8841)  Complications: No apparent anesthesia complications

## 2018-10-08 NOTE — Anesthesia Procedure Notes (Signed)
Procedure Name: MAC Date/Time: 10/08/2018 2:00 PM Performed by: Signe Colt, CRNA Pre-anesthesia Checklist: Patient identified, Emergency Drugs available, Suction available, Patient being monitored and Timeout performed Patient Re-evaluated:Patient Re-evaluated prior to induction Oxygen Delivery Method: Simple face mask

## 2018-10-08 NOTE — Interval H&P Note (Signed)
History and Physical Interval Note: no change in H and P  10/08/2018 1:16 PM  [name]  has presented today for surgery, with the diagnosis of temporal arteritis  The various methods of treatment have been discussed with the patient and family. After consideration of risks, benefits and other options for treatment, the patient has consented to  Procedure(s): BIOPSY LEFT  TEMPORAL ARTERY ERAS PATHWAY (Left) as a surgical intervention .  The patient's history has been reviewed, patient examined, no change in status, stable for surgery.  I have reviewed the patient's chart and labs.  Questions were answered to the patient's satisfaction.     Danyah Guastella A

## 2018-10-08 NOTE — Discharge Instructions (Signed)
Ok to shower starting tomorrow ° °Ice pack, tylenol, ibuprofen also for pain ° ° °Post Anesthesia Home Care Instructions ° °Activity: °Get plenty of rest for the remainder of the day. A responsible individual must stay with you for 24 hours following the procedure.  °For the next 24 hours, DO NOT: °-Drive a car °-Operate machinery °-Drink alcoholic beverages °-Take any medication unless instructed by your physician °-Make any legal decisions or sign important papers. ° °Meals: °Start with liquid foods such as gelatin or soup. Progress to regular foods as tolerated. Avoid greasy, spicy, heavy foods. If nausea and/or vomiting occur, drink only clear liquids until the nausea and/or vomiting subsides. Call your physician if vomiting continues. ° °Special Instructions/Symptoms: °Your throat may feel dry or sore from the anesthesia or the breathing tube placed in your throat during surgery. If this causes discomfort, gargle with warm salt water. The discomfort should disappear within 24 hours. ° °If you had a scopolamine patch placed behind your ear for the management of post- operative nausea and/or vomiting: ° °1. The medication in the patch is effective for 72 hours, after which it should be removed.  Wrap patch in a tissue and discard in the trash. Wash hands thoroughly with soap and water. °2. You may remove the patch earlier than 72 hours if you experience unpleasant side effects which may include dry mouth, dizziness or visual disturbances. °3. Avoid touching the patch. Wash your hands with soap and water after contact with the patch. °  ° °

## 2018-10-09 ENCOUNTER — Encounter (HOSPITAL_BASED_OUTPATIENT_CLINIC_OR_DEPARTMENT_OTHER): Payer: Self-pay | Admitting: Surgery

## 2018-10-13 ENCOUNTER — Ambulatory Visit
Admission: RE | Admit: 2018-10-13 | Discharge: 2018-10-13 | Disposition: A | Discharge status: Home / Self Care | Payer: 59 | Source: Ambulatory Visit | Attending: Internal Medicine | Admitting: Internal Medicine

## 2018-10-13 DIAGNOSIS — Z1231 Encounter for screening mammogram for malignant neoplasm of breast: Secondary | ICD-10-CM

## 2018-11-07 ENCOUNTER — Emergency Department (HOSPITAL_COMMUNITY): Payer: 59

## 2018-11-07 ENCOUNTER — Emergency Department (HOSPITAL_COMMUNITY)
Admission: EM | Admit: 2018-11-07 | Discharge: 2018-11-07 | Disposition: A | Discharge status: Home / Self Care | Payer: 59 | Attending: Emergency Medicine | Admitting: Emergency Medicine

## 2018-11-07 ENCOUNTER — Other Ambulatory Visit: Payer: Self-pay

## 2018-11-07 DIAGNOSIS — M2550 Pain in unspecified joint: Secondary | ICD-10-CM | POA: Diagnosis not present

## 2018-11-07 DIAGNOSIS — Z79899 Other long term (current) drug therapy: Secondary | ICD-10-CM | POA: Insufficient documentation

## 2018-11-07 DIAGNOSIS — L03211 Cellulitis of face: Secondary | ICD-10-CM

## 2018-11-07 DIAGNOSIS — J45909 Unspecified asthma, uncomplicated: Secondary | ICD-10-CM | POA: Insufficient documentation

## 2018-11-07 DIAGNOSIS — R51 Headache: Secondary | ICD-10-CM | POA: Diagnosis present

## 2018-11-07 LAB — BASIC METABOLIC PANEL
ANION GAP: 11 (ref 5–15)
BUN: 23 mg/dL — ABNORMAL HIGH (ref 6–20)
CALCIUM: 8.9 mg/dL (ref 8.9–10.3)
CO2: 21 mmol/L — ABNORMAL LOW (ref 22–32)
Chloride: 106 mmol/L (ref 98–111)
Creatinine, Ser: 1.54 mg/dL — ABNORMAL HIGH (ref 0.44–1.00)
GFR calc Af Amer: 43 mL/min — ABNORMAL LOW (ref >60)
GFR, EST NON AFRICAN AMERICAN: 37 mL/min — AB (ref >60)
GLUCOSE: 100 mg/dL — AB (ref 70–99)
Potassium: 3.8 mmol/L (ref 3.5–5.1)
Sodium: 138 mmol/L (ref 135–145)

## 2018-11-07 LAB — CBC WITH DIFFERENTIAL/PLATELET
Abs Immature Granulocytes: 0.03 10*3/uL (ref 0.00–0.07)
BASOS PCT: 0 %
Basophils Absolute: 0 10*3/uL (ref 0.0–0.1)
EOS ABS: 0.3 10*3/uL (ref 0.0–0.5)
Eosinophils Relative: 3 %
HCT: 44.1 % (ref 36.0–46.0)
Hemoglobin: 13.6 g/dL (ref 12.0–15.0)
IMMATURE GRANULOCYTES: 0 %
Lymphocytes Relative: 14 %
Lymphs Abs: 1.4 10*3/uL (ref 0.7–4.0)
MCH: 28.8 pg (ref 26.0–34.0)
MCHC: 30.8 g/dL (ref 30.0–36.0)
MCV: 93.2 fL (ref 80.0–100.0)
MONOS PCT: 5 %
Monocytes Absolute: 0.5 10*3/uL (ref 0.1–1.0)
Neutro Abs: 7.8 10*3/uL — ABNORMAL HIGH (ref 1.7–7.7)
Neutrophils Relative %: 78 %
PLATELETS: 235 10*3/uL (ref 150–400)
RBC: 4.73 MIL/uL (ref 3.87–5.11)
RDW: 13.4 % (ref 11.5–15.5)
WBC: 10 10*3/uL (ref 4.0–10.5)
nRBC: 0 % (ref 0.0–0.2)

## 2018-11-07 MED ORDER — IOHEXOL 300 MG/ML  SOLN
75.0000 mL | Freq: Once | INTRAMUSCULAR | Status: AC | PRN
  Administered 2018-11-07: 60 mL via INTRAVENOUS

## 2018-11-07 MED ORDER — MORPHINE SULFATE (PF) 4 MG/ML IV SOLN
4.0000 mg | Freq: Once | INTRAVENOUS | Status: AC
  Administered 2018-11-07: 4 mg via INTRAVENOUS
  Filled 2018-11-07: qty 1

## 2018-11-07 MED ORDER — CLINDAMYCIN HCL 300 MG PO CAPS: 300.0000 mg | ORAL_CAPSULE | Freq: Three times a day (TID) | ORAL | 0 refills | Status: AC

## 2018-11-07 NOTE — ED Provider Notes (Signed)
  Face-to-face evaluation   History: She presents for evaluation of pain and swelling left cheek/ear region.  Rapid onset over the last several days.  She had a biopsy of the left temporal artery 1 month ago.   Physical exam: Alert, calm, cooperative.  Left preauricular area, associated with a scar from recent surgery, has mild pain and swelling and induration without fluctuance.  This extends below the angle of the jaw.  Mild pain and swelling of the anterior left pinna.  Normal left external auditory canal and TM.  Medical screening examination/treatment/procedure(s) were conducted as a shared visit with non-physician practitioner(s) and myself.  I personally evaluated the patient during the encounter    Mancel BaleWentz, Evanthia Maund, MD 11/08/18 [phone]

## 2018-11-07 NOTE — ED Provider Notes (Signed)
[facility] EMERGENCY DEPARTMENT Provider Note   CSN: [id number] Arrival date & time: 11/07/18  1329     History   Chief Complaint No chief complaint on file.   HPI [name] is a 57 y.o. female presenting for evaluation of left-sided facial pain and swelling.  Patient states on October 31, approximately 4 weeks ago, she had a left temporal artery biopsy.  She was seen 2 weeks ago by ENT, was healing well.  Over the past 2 to 3 days, she has had increased pain, redness, and swelling of this area.  She also reports pain behind her ear and going down into her jaw.  Last night she had a temperature of 101.  Additionally, patient reports joint aching, of bilateral knees and hips.  Over the past year, she has been having polyarthralgia intermittently.  She has not had this evaluated yet.  Patient denies vision changes, hearing changes, numbness, slurred speech, chest pain, shortness breath, cough, nausea, vomiting of abdominal pain, urinary sxs, abnormal BMs. Pt states the biopsy was negative. Pt's mom with h/o GCA and RA. Fam h/o lupus and other autoimmune diseases.   HPI  Past Medical History:  Diagnosis Date  . Allergic rhinitis   . Anxiety   . Arthritis   . Asthma   . GERD (gastroesophageal reflux disease)   . IBS (irritable bowel syndrome)   . Insomnia   . Osteopenia   . Osteoporosis 2016   T score -2.6 DEXA 2011 with T score -2.6  . Plantar fasciitis     Patient Active Problem List   Diagnosis Date Noted  . Hypersomnia, persistent 10/29/2017  . Vivid dream 10/29/2017  . RLS (restless legs syndrome) 10/29/2017  . Mixed obsessional thoughts and acts 10/29/2017  . Compulsive buying 10/29/2017  . Compulsive eating patterns 10/29/2017  . GERD (gastroesophageal reflux disease) 08/24/2013  . Acute bronchitis 08/24/2013  . Osteoporosis   . Unspecified vitamin D deficiency   . ALLERGIC CONJUNCTIVITIS 05/05/2008  . NASAL POLYP 05/05/2008  . Seasonal  allergic rhinitis 05/05/2008  . Pneumothorax 05/05/2008  . BREAST MASS, BENIGN 05/05/2008    Past Surgical History:  Procedure Laterality Date  . APPENDECTOMY    . ARTERY BIOPSY Left 10/08/2018   Procedure: BIOPSY LEFT  TEMPORAL ARTERY;  Surgeon: Abigail Miyamoto, MD;  Location: [facility];  Service: General;  Laterality: Left;   ERAS PATHWAY  . bladder distention  2009  . BREAST EXCISIONAL BIOPSY    . BREAST LUMPECTOMY     Benign-Bilateral  . CALCANEAL OSTEOTOMY Right 01/01/2018   Procedure: Right Lateral Calcaneal Osteotomy;  Surgeon: Toni Arthurs, MD;  Location: [facility];  Service: Orthopedics;  Laterality: Right;  . KNEE SURGERY  04,05  . KNEE SURGERY  2016  . METATARSAL OSTEOTOMY Right 01/01/2018   Procedure: Dorsiflexion Osteotomy of the First Metatarsal; Plantar Fascia Release;  Surgeon: Toni Arthurs, MD;  Location: [facility];  Service: Orthopedics;  Laterality: Right;  . OOPHORECTOMY     laparoscopic BSO for benign cysts  . REFRACTIVE SURGERY  2016  . REPAIR OF RECTOCELE    . TENDON TRANSFER Right 01/01/2018   Procedure: Right Peroneus Longus Tendon Debridement and Tenolysis; Transfer of Peroneus Brevis to Peroneus Longus Tendon;  Surgeon: Toni Arthurs, MD;  Location: [facility];  Service: Orthopedics;  Laterality: Right;  Marland Kitchen VAGINAL HYSTERECTOMY     early 2000's for uterine prolapse and rectocele     OB History  Gravida  3   Para  2   Term  2   Preterm      AB  1   Living  2     SAB      TAB      Ectopic      Multiple      Live Births               Home Medications    Prior to Admission medications   Medication Sig Start Date End Date Taking? Authorizing Provider  cetirizine (ZYRTEC) 10 MG tablet Take 10 mg by mouth daily.  09/08/17  Yes [provider]  venlafaxine XR (EFFEXOR-XR) 75 MG 24 hr capsule Take 75 mg by mouth daily with breakfast.   Yes [provider]    budesonide (RHINOCORT ALLERGY) 32 MCG/ACT nasal spray Place 2 sprays into both nostrils 2 (two) times daily.     [provider]  clindamycin (CLEOCIN) 300 MG capsule Take 1 capsule (300 mg total) by mouth 3 (three) times daily for 7 days. 11/07/18 11/14/18  Rykin Route, PA-C  famotidine (PEPCID) 20 MG tablet Take 20 mg by mouth at bedtime.  08/18/17   [provider]  fluticasone furoate-vilanterol (BREO ELLIPTA) 100-25 MCG/INH AEPB Inhale 1 puff into the lungs daily.    [provider]  LORazepam (ATIVAN) 1 MG tablet TK 1/2 TO 1 T PO BID PRN 08/18/17   [provider]  oxyCODONE (OXY IR/ROXICODONE) 5 MG immediate release tablet Take 1 tablet (5 mg total) by mouth every 6 (six) hours as needed for moderate pain, severe pain or breakthrough pain. 10/08/18   Abigail Miyamoto, MD  RABEprazole (ACIPHEX) 20 MG tablet Take 20 mg by mouth daily.    [provider]  topiramate (TOPAMAX) 100 MG tablet Take 100 mg by mouth 2 (two) times daily.    [provider]    Family History Family History  Problem Relation Age of Onset  . Hypertension Mother   . Diabetes Father   . Hypertension Father   . Heart disease Father   . Cancer Father        Prostate  . Hypertension Sister   . Cancer Sister        Melanoma  . Heart disease Paternal Uncle   . Diabetes Paternal Grandmother   . Heart disease Paternal Grandmother   . Diabetes Maternal Grandmother   . Heart disease Maternal Grandmother   . Diabetes Maternal Grandfather   . Heart disease Maternal Grandfather   . Diabetes Paternal Grandfather   . Heart disease Paternal Grandfather     Social History Social History   Tobacco Use  . Smoking status: Never Smoker  . Smokeless tobacco: Never Used  Substance Use Topics  . Alcohol use: Yes    Alcohol/week: 0.0 standard drinks    Comment: rare  . Drug use: No     Allergies   Cephalexin; Decongestant [pseudoephedrine hcl er];  Erythromycin; Neomycin-bacitracin zn-polymyx; Petrol dist-piperonyl butoxide-pyrethrins [pyrethrins-piperonyl butoxide]; Promethazine hcl; Restasis [cyclosporine]; Sertraline hcl; and Codeine   Review of Systems Review of Systems  Constitutional: Positive for fever.  HENT:       L temple/facial pain and swelling.   All other systems reviewed and are negative.    Physical Exam Updated Vital Signs BP (!) 101/54 (BP Location: Left Arm)   Pulse 83   Temp 98 F (36.7 C) (Oral)   Resp 16   Ht 5\' 1"  (1.549 m)  Wt 64 kg   LMP 12/09/1996   SpO2 100%   BMI 26.64 kg/m   Physical Exam  Constitutional: She is oriented to person, place, and time. She appears well-developed and well-nourished. No distress.  Sitting comfortably in the bed in no acute distress  HENT:  Head: Normocephalic and atraumatic.    Right Ear: Tympanic membrane, external ear and ear canal normal.  Left Ear: Tympanic membrane, external ear and ear canal normal.  Nose: Nose normal.  Mouth/Throat: Uvula is midline, oropharynx is clear and moist and mucous membranes are normal.  Facial erythema, swelling, and tenderness.  No fluctuance.  Patient with tenderness of the mastoid.  No obvious middle ear infection.  Eyes: Pupils are equal, round, and reactive to light. Conjunctivae and EOM are normal.  EOMI and PERRLA.  No nystagmus.  Neck: Normal range of motion. Neck supple.  Eating head in all directions without pain.  No tenderness palpation midline C-spine.  Cardiovascular: Normal rate, regular rhythm and intact distal pulses.  Pulmonary/Chest: Effort normal and breath sounds normal. No respiratory distress. She has no wheezes.  Abdominal: Soft. She exhibits no distension. There is no tenderness.  Musculoskeletal: Normal range of motion.  Neurological: She is alert and oriented to person, place, and time.  Skin: Skin is warm and dry. Capillary refill takes less than 2 seconds.  Psychiatric: She has a normal mood  and affect.  Nursing note and vitals reviewed.    ED Treatments / Results  Labs (all labs ordered are listed, but only abnormal results are displayed) Labs Reviewed  CBC WITH DIFFERENTIAL/PLATELET - Abnormal; Notable for the following components:      Result Value   Neutro Abs 7.8 (*)    All other components within normal limits  BASIC METABOLIC PANEL - Abnormal; Notable for the following components:   CO2 21 (*)    Glucose, Bld 100 (*)    BUN 23 (*)    Creatinine, Ser 1.54 (*)    GFR calc non Af Amer 37 (*)    GFR calc Af Amer 43 (*)    All other components within normal limits    EKG None  Radiology Ct Orbits W Contrast  Result Date: 11/07/2018 CLINICAL DATA:  Increasing left-sided facial redness and swelling. Biopsy in this region 1 month ago to evaluate for temporal arteritis. Pain extends into the left ear and neck. Febrile. EXAM: CT ORBITS WITH CONTRAST TECHNIQUE: Multidetector CT images was performed according to the standard protocol following intravenous contrast administration. CONTRAST:  60mL OMNIPAQUE IOHEXOL 300 MG/ML  SOLN COMPARISON:  Head CT 09/12/2017 FINDINGS: Orbits: The globes appear intact. No orbital mass or inflammatory changes are identified. Visualized sinuses: The visualized paranasal sinuses and mastoid air cells are clear. Soft tissues: Mild swelling involving the left temporal superficial soft tissues extending from the anterior aspect of the external ear to the anterior aspect of the zygomatic arch. No fluid collection. No evidence of primary parotid inflammation. Limited intracranial: Unremarkable. IMPRESSION: Superficial left temporal soft tissue swelling/inflammation which may reflect cellulitis. No abscess. Electronically Signed   By: Sebastian AcheAllen  Grady M.D.   On: 11/07/2018 15:44    Procedures Procedures (including critical care time)  Medications Ordered in ED Medications  morphine 4 MG/ML injection 4 mg (4 mg Intravenous Given 11/07/18 1430)    iohexol (OMNIPAQUE) 300 MG/ML solution 75 mL (60 mLs Intravenous Contrast Given 11/07/18 1516)     Initial Impression / Assessment and Plan / ED Course  I have reviewed  the triage vital signs and the nursing notes.  Pertinent labs & imaging results that were available during my care of the patient were reviewed by me and considered in my medical decision making (see chart for details).     Patient presenting for evaluation of left temporal erythema, swelling, and tenderness.  Fevers last night.  Symptoms consistent with infection.  Patient also reporting polyarthralgia.  This may be related to her fever last night, although as patient has had intermittent symptoms over the past year, consider possible autoimmune cause.  Will obtain basic labs and imaging of the head to rule out deeper infection such as abscess or mastoiditis. Morphine for pain.  Labs reassuring, no leukocytosis.  Mild elevation in creatinine, consider slight dehydration due to fever and decreased p.o. intake.  CT shows cellulitis without deeper underlying infection.  Case discussed with attending, Dr. Effie Shy evaluated the patient.  Will treat with antibiotics, warm compress, and follow-up with PCP.  Patient has an appointment on Tuesday.  I recommended that patient discuss her polyarthralgia with her PCP.  Patient is allergic to Keflex, will give Clinda.  At this time, patient appears safe for discharge.  Return precautions given.  Patient states she understands and agrees to plan.  Final Clinical Impressions(s) / ED Diagnoses   Final diagnoses:  Cellulitis of face  Polyarthralgia    ED Discharge Orders         Ordered    clindamycin (CLEOCIN) 300 MG capsule  3 times daily     11/07/18 1622           Benjamine Strout, PA-C 11/07/18 1820    Mancel Bale, MD 11/08/18 [phone]

## 2018-11-07 NOTE — ED Triage Notes (Signed)
Pt reports that she had a biopsy on left side of face on 10/31. Pt complains of increased redness and swelling to the site. Pt reports that it started that it started feeling tingly 3-4 days. Pt reports left ear pain now and pain on the left side of her face radiating into her neck. Pt reports seeing her ENT two weeks ago and they said the site looked like it was healing well. Pt reports a temperature of 101 last night and took 3 tylenol at 12noon today. Pt complains of chills and feeling achy as well.

## 2018-11-07 NOTE — Discharge Instructions (Signed)
Take antibiotics as prescribed.  Take the entire course, even if your symptoms improve. Use warm compresses to help with pain and swelling. Use Tylenol and ibuprofen for pain and swelling. Follow-up with your primary care doctor at your scheduled appointment next week for further evaluation of your symptoms.  Additionally, you should discuss your joint pain that you have been having for the past year. Make sure you are staying well-hydrated with water.  This is very important.  Your urine should be clear to pale yellow. Return to the emergency room with any new, worsening, concerning symptoms.

## 2018-11-14 IMAGING — CT CT HEAD W/O CM
4 series · 17 of 47 positions shown, 19 images · non-contrast
Comparison: None.

CLINICAL DATA: Confusion and ataxia for 1 year

EXAM:
CT HEAD WITHOUT CONTRAST
TECHNIQUE: Contiguous axial images were obtained from the base of the skull
through the vertex without intravenous contrast.

[Series 3: head wo · axial · 0.42mm/px · z∈[-119,+1]mm · 7 of 34 slices shown, 9 images]
[im 5/34  brain]
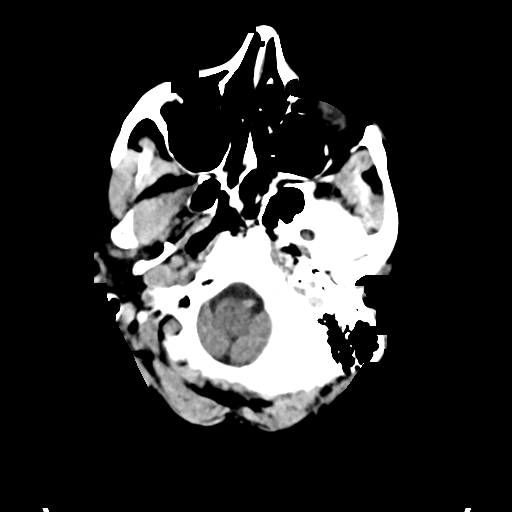
[im 5/34  bone]
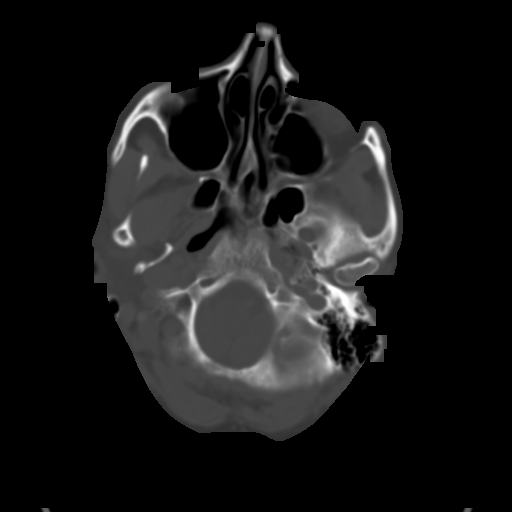
[im 9/34  brain]
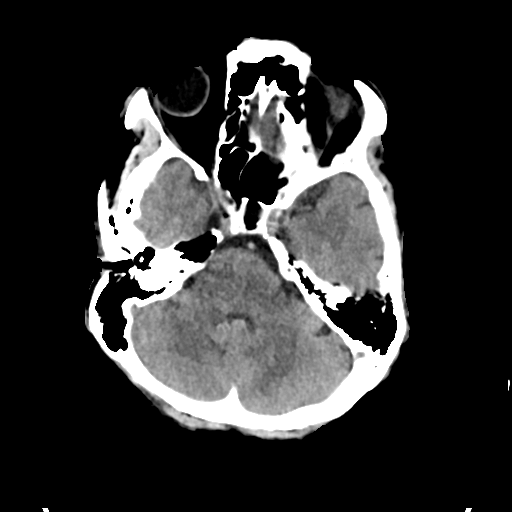
[im 13/34  brain]
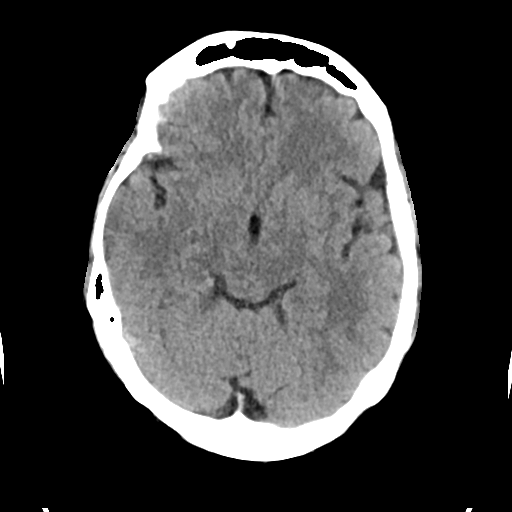
[im 17/34  brain]
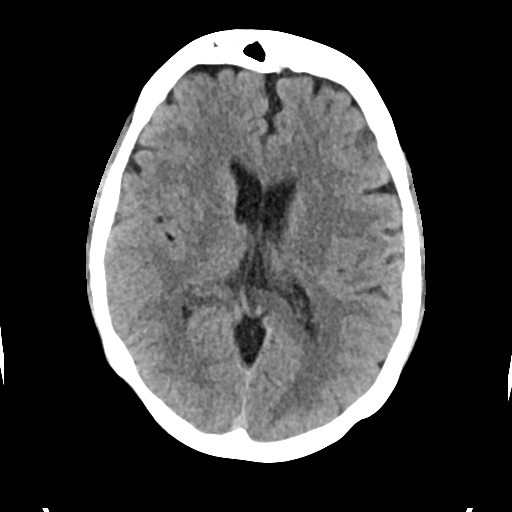
[im 21/34  brain]
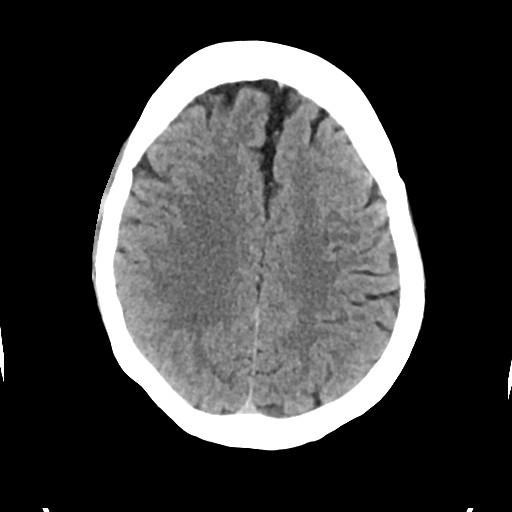
[im 21/34  bone]
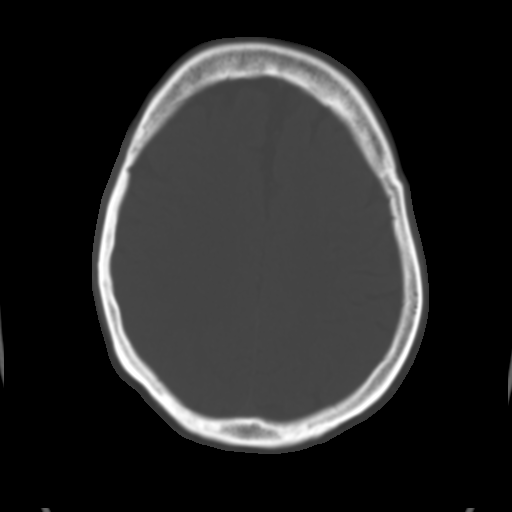
[im 25/34  brain]
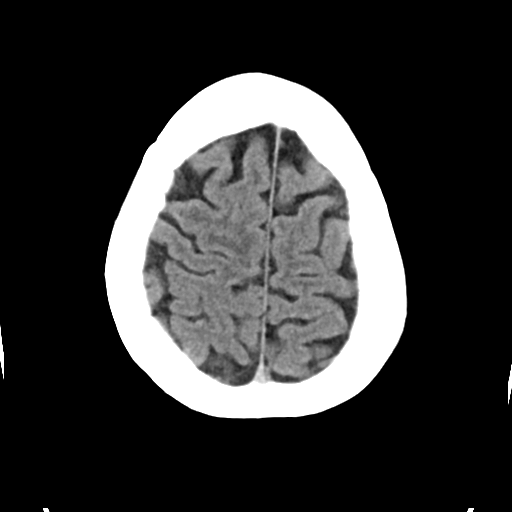
[im 29/34  brain]
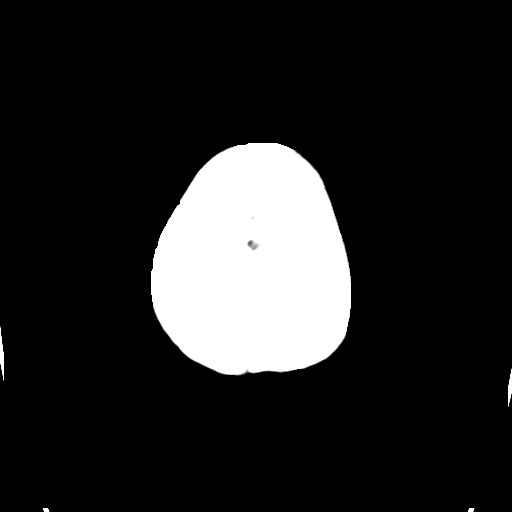

[Series 4: head bone · axial · 0.42mm/px · z∈[-123,-65]mm · 4 of 85 slices shown]
[im 9/85  bone]
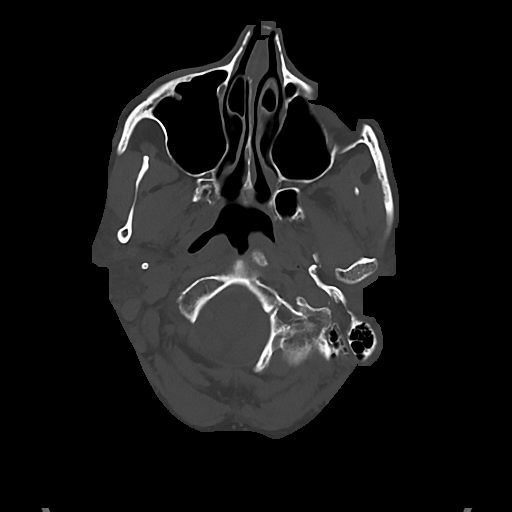
[im 17/85  bone]
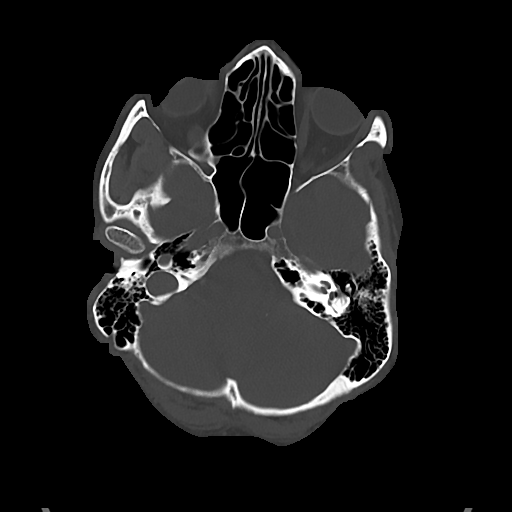
[im 26/85  bone]
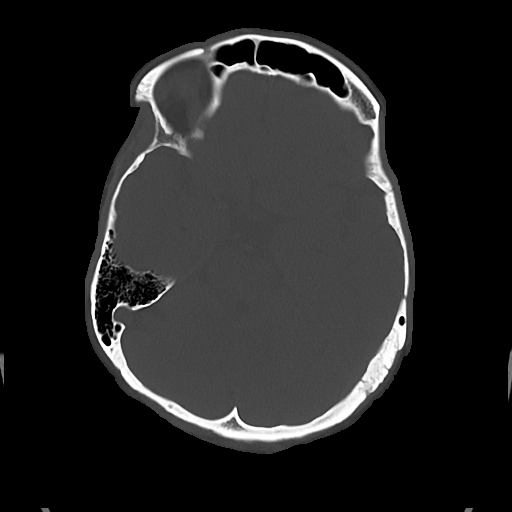
[im 38/85  bone]
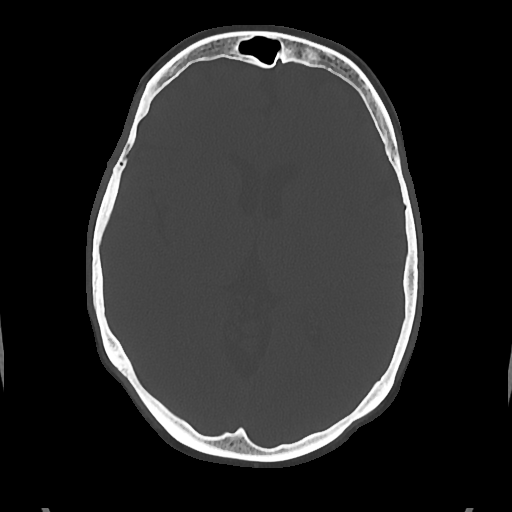

[Series 5: cor soft · coronal · 0.33mm/px · 3 of 70 slices shown]
[im 24/70  brain]
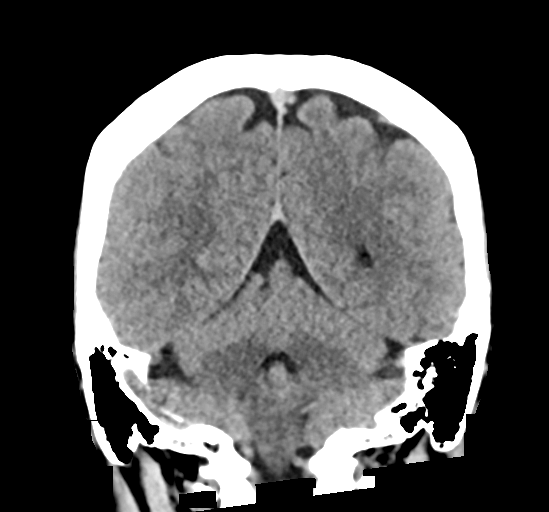
[im 31/70  brain]
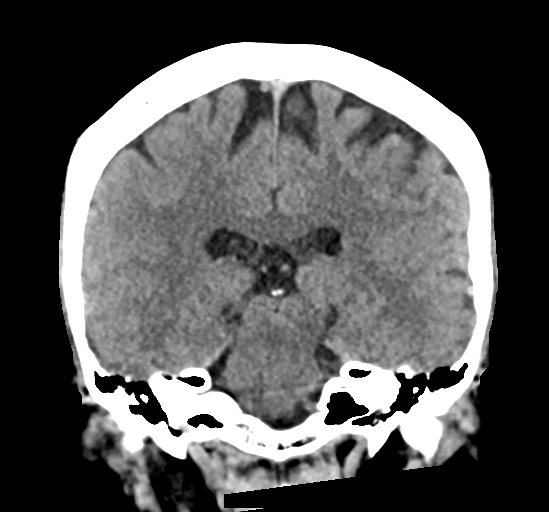
[im 39/70  brain]
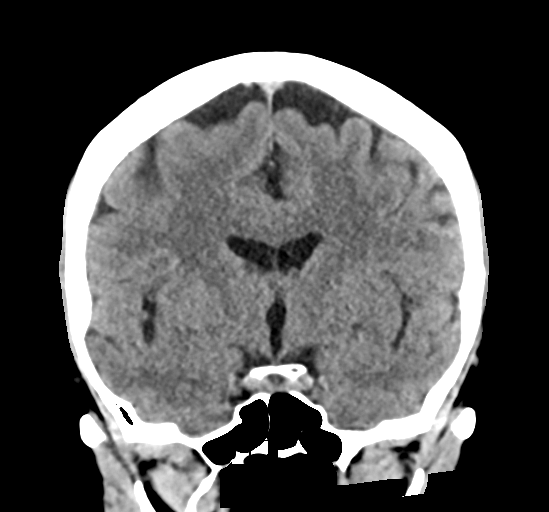

[Series 6: sag soft · sagittal · 0.33mm/px · 3 of 67 slices shown]
[im 23/67  brain]
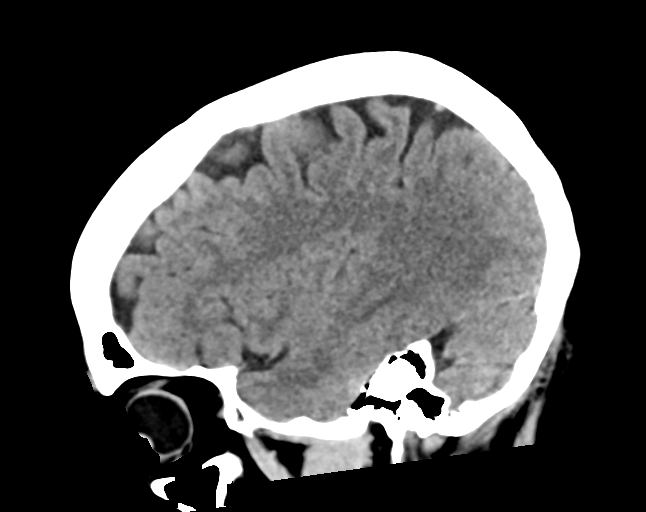
[im 34/67  brain]
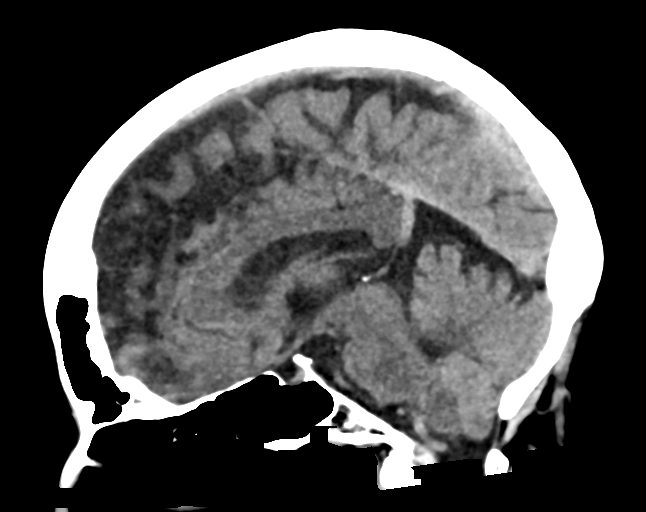
[im 45/67  brain]
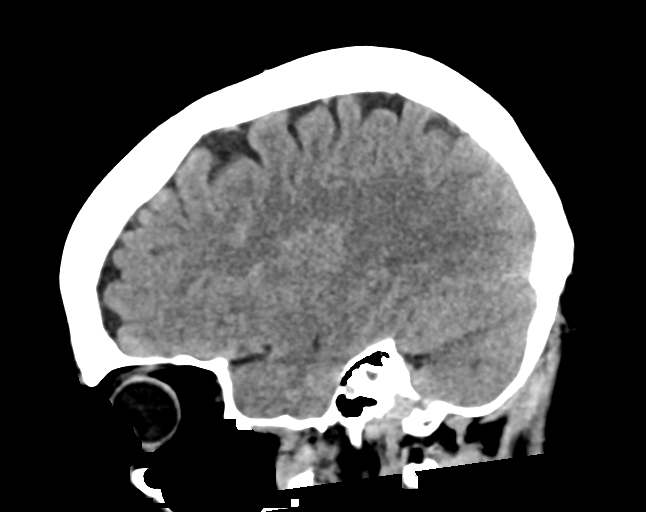

[17 of 47 positions shown; findings below may reference images not displayed]

FINDINGS: Brain: No evidence of acute infarction, hemorrhage, hydrocephalus,
extra-axial collection or mass lesion/mass effect.

Vascular: No hyperdense vessels.  No unexpected calcification

Skull: Normal. Negative for fracture or focal lesion.

Sinuses/Orbits: No acute finding.

Other: None
IMPRESSION: No CT evidence for acute intracranial abnormality.

## 2019-01-02 ENCOUNTER — Other Ambulatory Visit: Payer: Self-pay

## 2019-01-02 ENCOUNTER — Emergency Department (HOSPITAL_COMMUNITY)
Admission: EM | Admit: 2019-01-02 | Discharge: 2019-01-02 | Disposition: A | Discharge status: Home / Self Care | Payer: 59 | Attending: Emergency Medicine | Admitting: Emergency Medicine

## 2019-01-02 ENCOUNTER — Encounter (HOSPITAL_COMMUNITY): Payer: Self-pay | Admitting: *Deleted

## 2019-01-02 DIAGNOSIS — Y998 Other external cause status: Secondary | ICD-10-CM | POA: Insufficient documentation

## 2019-01-02 DIAGNOSIS — W260XXA Contact with knife, initial encounter: Secondary | ICD-10-CM | POA: Diagnosis not present

## 2019-01-02 DIAGNOSIS — S61011A Laceration without foreign body of right thumb without damage to nail, initial encounter: Secondary | ICD-10-CM | POA: Diagnosis not present

## 2019-01-02 DIAGNOSIS — Y939 Activity, unspecified: Secondary | ICD-10-CM | POA: Diagnosis not present

## 2019-01-02 DIAGNOSIS — Y929 Unspecified place or not applicable: Secondary | ICD-10-CM | POA: Diagnosis not present

## 2019-01-02 DIAGNOSIS — Z5321 Procedure and treatment not carried out due to patient leaving prior to being seen by health care provider: Secondary | ICD-10-CM | POA: Insufficient documentation

## 2019-01-02 NOTE — ED Notes (Signed)
Pt states "my hand has stopped bleeding, I think I am gonna just leave" pt encouraged to stay, this RN explained to pt that her name has been roomed she is just waiting to be called when it is clean, pt refused to stay.

## 2019-01-02 NOTE — ED Triage Notes (Signed)
Pt reports she cut the tip of her right thumb with a Mandelin knife. Unknown tetanus

## 2019-07-19 ENCOUNTER — Other Ambulatory Visit: Payer: Self-pay

## 2019-07-20 ENCOUNTER — Encounter: Payer: Self-pay | Admitting: Gynecology

## 2019-07-20 ENCOUNTER — Ambulatory Visit (INDEPENDENT_AMBULATORY_CARE_PROVIDER_SITE_OTHER): Payer: BC Managed Care – PPO | Admitting: Gynecology

## 2019-07-20 VITALS — BP 118/76 | Ht 61.0 in | Wt 139.0 lb

## 2019-07-20 DIAGNOSIS — N952 Postmenopausal atrophic vaginitis: Secondary | ICD-10-CM | POA: Diagnosis not present

## 2019-07-20 DIAGNOSIS — Z01419 Encounter for gynecological examination (general) (routine) without abnormal findings: Secondary | ICD-10-CM | POA: Diagnosis not present

## 2019-07-20 DIAGNOSIS — N941 Unspecified dyspareunia: Secondary | ICD-10-CM | POA: Diagnosis not present

## 2019-07-20 DIAGNOSIS — M810 Age-related osteoporosis without current pathological fracture: Secondary | ICD-10-CM | POA: Diagnosis not present

## 2019-07-20 NOTE — Progress Notes (Signed)
Obdulia Steier May 19, 1961 [id number]        58 y.o.  [id number] for annual gynecologic exam.  Complaining of dyspareunia despite using OTC lubricants.  Not having vaginal dryness otherwise.  No discharge itching irritation or odor.  Notes urinary frequency whenever she is under stress but otherwise does well.  No dysuria urgency fever or chills.  History of osteoporosis.  Was to schedule DEXA but never did.  Past medical history,surgical history, problem list, medications, allergies, family history and social history were all reviewed and documented as reviewed in the EPIC chart.  ROS:  Performed with pertinent positives and negatives included in the history, assessment and plan.   Additional significant findings : None   Exam: Caryn Bee assistant Vitals:   07/20/19 0906  BP: 118/76  Weight: 139 lb (63 kg)  Height: 5\' 1"  (1.549 m)   Body mass index is 26.26 kg/m.  General appearance:  Normal affect, orientation and appearance. Skin: Grossly normal HEENT: Without gross lesions.  No cervical or supraclavicular adenopathy. Thyroid normal.  Lungs:  Clear without wheezing, rales or rhonchi Cardiac: RR, without RMG Abdominal:  Soft, nontender, without masses, guarding, rebound, organomegaly or hernia Breasts:  Examined lying and sitting without masses, retractions, discharge or axillary adenopathy. Pelvic:  Ext, BUS, Vagina: With atrophic changes  Adnexa: Without masses or tenderness    Anus and perineum: Normal   Rectovaginal: Normal sphincter tone without palpated masses or tenderness.    Assessment/Plan:  58 y.o. [id number] female for annual gynecologic exam.  Status post TVH posterior repair and subsequent laparoscopic BSO for ovarian cysts  1. Postmenopausal/atrophic genital changes/dyspareunia.  Had been on estradiol patches previously but discontinued.  Is doing well as far as no significant hot flushes or sweats.  Has developed pain with intercourse consistently.  Has tried OTC  products without success.  Exam shows atrophic changes but otherwise normal.  Options for management reviewed to include trying different lubricants, vaginal estrogen such as tablets or cream, Osphena, [employer] laser.  The pros and cons of each choice discussed as well as the risks.  Risks of vaginal estrogen absorption with systemic effects such as thrombosis and breast issues reviewed.  Patient would like to go ahead and start.  Will use estradiol vaginal cream through [facility] twice weekly.  Will follow-up if any issues. 2. Urinary frequency when under stress.  I think this is stress related.  Does not occur otherwise.  Will check baseline urine analysis rule out low-grade infection. 3. Osteoporosis.  DEXA 2016 T score -2.6.  Used Reclast x1 and Prolia x1.  Stopped due to side effects.  Was to recheck her bone density previously but never followed up for this.  I stressed the need to get a baseline bone density and she agrees to schedule in follow-up for this and then we will discuss treatment options if needed. 4. Mammography coming due in November.  Breast exam normal today. 5. Pap smear 2019.  No Pap smear done today.  No history of significant abnormal Pap smears.  Options to stop screening per current screening guidelines reviewed.  Will readdress on annual basis. 6. Colonoscopy 2019.  Repeat at their recommended interval. 7. Health maintenance.  No routine lab work done as patient does this elsewhere.  Had elevated renal function at ER visit previously.  Will recheck now with CMP.  Follow-up for DEXA.  Follow-up in 1 year for annual exam.   Anastasio Auerbach MD, 9:34 AM 07/20/2019

## 2019-07-20 NOTE — Patient Instructions (Signed)
Start on the vaginal estrogen cream twice weekly through [facility].  Follow-up if any issues with this.  Schedule in follow-up for your bone density.

## 2019-07-21 ENCOUNTER — Telehealth: Payer: Self-pay | Admitting: *Deleted

## 2019-07-21 ENCOUNTER — Encounter: Payer: Self-pay | Admitting: Gynecology

## 2019-07-21 LAB — COMPREHENSIVE METABOLIC PANEL
AG Ratio: 1.7 (calc) (ref 1.0–2.5)
ALT: 15 U/L (ref 6–29)
AST: 16 U/L (ref 10–35)
Albumin: 4.2 g/dL (ref 3.6–5.1)
Alkaline phosphatase (APISO): 87 U/L (ref 37–153)
BUN: 13 mg/dL (ref 7–25)
CO2: 26 mmol/L (ref 20–32)
Calcium: 9.8 mg/dL (ref 8.6–10.4)
Chloride: 106 mmol/L (ref 98–110)
Creat: 1 mg/dL (ref 0.50–1.05)
Globulin: 2.5 g/dL (calc) (ref 1.9–3.7)
Glucose, Bld: 89 mg/dL (ref 65–99)
Potassium: 4.2 mmol/L (ref 3.5–5.3)
Sodium: 140 mmol/L (ref 135–146)
Total Bilirubin: 0.4 mg/dL (ref 0.2–1.2)
Total Protein: 6.7 g/dL (ref 6.1–8.1)

## 2019-07-21 LAB — URINALYSIS, COMPLETE W/RFL CULTURE
Bacteria, UA: NONE SEEN /HPF
Bilirubin Urine: NEGATIVE
Glucose, UA: NEGATIVE
Hgb urine dipstick: NEGATIVE
Hyaline Cast: NONE SEEN /LPF
Ketones, ur: NEGATIVE
Leukocyte Esterase: NEGATIVE
Nitrites, Initial: NEGATIVE
Protein, ur: NEGATIVE
RBC / HPF: NONE SEEN /HPF (ref 0–2)
Specific Gravity, Urine: 1.005 (ref 1.001–1.03)
Squamous Epithelial / LPF: NONE SEEN /HPF (ref <=5)
WBC, UA: NONE SEEN /HPF (ref 0–5)
pH: 7.5 (ref 5.0–8.0)

## 2019-07-21 LAB — NO CULTURE INDICATED

## 2019-07-21 MED ORDER — NONFORMULARY OR COMPOUNDED ITEM: 4 refills | Status: DC

## 2019-07-21 NOTE — Telephone Encounter (Signed)
-----   Message from Anastasio Auerbach, MD sent at 07/20/2019  9:41 AM EDT ----- Call into [facility] prefilled vaginal estradiol syringes twice weekly 48-month supply 1 year refill

## 2019-07-21 NOTE — Telephone Encounter (Signed)
Rx called in 

## 2019-09-07 ENCOUNTER — Encounter: Payer: Self-pay | Admitting: Gynecology

## 2019-09-20 ENCOUNTER — Other Ambulatory Visit: Payer: Self-pay | Admitting: Internal Medicine

## 2019-09-20 DIAGNOSIS — Z1231 Encounter for screening mammogram for malignant neoplasm of breast: Secondary | ICD-10-CM

## 2019-09-29 ENCOUNTER — Other Ambulatory Visit: Payer: Self-pay

## 2019-10-13 ENCOUNTER — Other Ambulatory Visit: Payer: Self-pay

## 2019-10-14 ENCOUNTER — Other Ambulatory Visit: Payer: Self-pay | Admitting: Gynecology

## 2019-10-14 ENCOUNTER — Ambulatory Visit (INDEPENDENT_AMBULATORY_CARE_PROVIDER_SITE_OTHER): Payer: 59

## 2019-10-14 DIAGNOSIS — Z780 Asymptomatic menopausal state: Secondary | ICD-10-CM

## 2019-10-14 DIAGNOSIS — M810 Age-related osteoporosis without current pathological fracture: Secondary | ICD-10-CM

## 2019-10-15 ENCOUNTER — Encounter: Payer: Self-pay | Admitting: Gynecology

## 2019-10-19 ENCOUNTER — Telehealth: Payer: Self-pay | Admitting: *Deleted

## 2019-10-19 DIAGNOSIS — E559 Vitamin D deficiency, unspecified: Secondary | ICD-10-CM

## 2019-10-19 MED ORDER — VITAMIN D (ERGOCALCIFEROL) 1.25 MG (50000 UNIT) PO CAPS: 50000.0000 [IU] | ORAL_CAPSULE | ORAL | 3 refills | Status: DC

## 2019-10-19 NOTE — Telephone Encounter (Signed)
I have not used the shot but there is the vitamin D prescription 50,000 unit pill.  She could start on that now and continue weekly for 2 months and then check a vitamin D level at that time.

## 2019-10-19 NOTE — Telephone Encounter (Signed)
Pt informed and Rx sent. Vit D order in [place]

## 2019-10-19 NOTE — Telephone Encounter (Signed)
Pt called, husband tested positive for COVID yesterday.  She had a test through [facility]. last night and results still pending.  Upon speaking with the Health Dept doctor as required before testing.  She told him that she was advised to get a Vit D level checked last week due to bone density results. Last vit D 2018 was low.  Not taking Vit D supplement.  She said she was advised that patients with Vit D deficiency do not do as well with COVID as those who are not deficient.  That doctor advised her to contact her doctor and discuss getting a shot of Vit D supplementation.  She said she called Dr Inda Merlin office, her PCP and they advised her they didn't know anything about that kind of shot.   I told her that we do not see patients who are waiting for COVID results or who are positive.  She asked that I ask you about the Vitamin D supplement shot.  Please advise your thoughts. Sharrie Rothman CMA

## 2019-10-22 ENCOUNTER — Telehealth: Payer: 59 | Admitting: Physician Assistant

## 2019-10-22 ENCOUNTER — Encounter: Payer: Self-pay | Admitting: Physician Assistant

## 2019-10-22 DIAGNOSIS — R05 Cough: Secondary | ICD-10-CM

## 2019-10-22 DIAGNOSIS — R059 Cough, unspecified: Secondary | ICD-10-CM

## 2019-10-22 DIAGNOSIS — J029 Acute pharyngitis, unspecified: Secondary | ICD-10-CM

## 2019-10-22 DIAGNOSIS — H538 Other visual disturbances: Secondary | ICD-10-CM

## 2019-10-22 NOTE — Progress Notes (Signed)
  E-Visit for [employer] Virus Screening  Based on what you have shared with me, you need to seek an evaluation for a severe illness that is causing your symptoms which may be coronavirus or some other illness. I recommend that you be seen and evaluated "face to face". If you are considered high risk for Corona virus because of a known exposure, fever, shortness of breath and cough, OR if you have severe symptoms of any kind, seek medical care at an emergency room. Our Emergency Departments are best equipped to handle patients with severe symptoms.  You will be evaluated by the ER provider (or higher level of care provider) who will determine whether you need formal testing.  If you are having a true medical emergency please call 911.  [name],  You have indicated that you have blurred vision. This is not considered a common symptoms of Covid 19. It is advised that you have a face to face evaluation of your blurred vision to rule out any vision threatening illness.    I recommend the following:  . [facility] Emergency Department [place], [place], [place] [address] [phone]  . [facility] Emergency Department [place], [place], [place] [address] [phone]  . [facility] Emergency Department [place], [place], [place] [address] [phone]  . [facility] Emergency Department [address] [place], [place],  [address] [phone]  . [facility] Emergency Department [place], [place],  [address] [phone]  NOTE: If you entered your credit card information for this eVisit, you will not be charged. You may see a "hold" on your card for the $35 but that hold will drop off and you will not have a charge processed.   Your e-visit answers were reviewed by a board certified advanced clinical practitioner to complete your personal care plan.   Thank you for using e-Visits.  I spent 5-10 minutes on review and completion of this note- [name] [facility]

## 2019-11-03 ENCOUNTER — Ambulatory Visit: Payer: 59

## 2019-11-03 ENCOUNTER — Other Ambulatory Visit: Payer: Self-pay

## 2019-11-03 DIAGNOSIS — Z20822 Contact with and (suspected) exposure to covid-19: Secondary | ICD-10-CM

## 2019-11-05 LAB — NOVEL CORONAVIRUS, NAA: SARS-CoV-2, NAA: NOT DETECTED

## 2019-11-29 IMAGING — CR DG CHEST 2V
2 series · 2 of 2 positions shown · non-contrast
Comparison: PA and lateral chest 08/04/2018. CT chest and single
view of the chest 08/14/2017.

CLINICAL DATA: Acute onset chest pain last night.

EXAM:
CHEST - 2 VIEW

[chest pa]
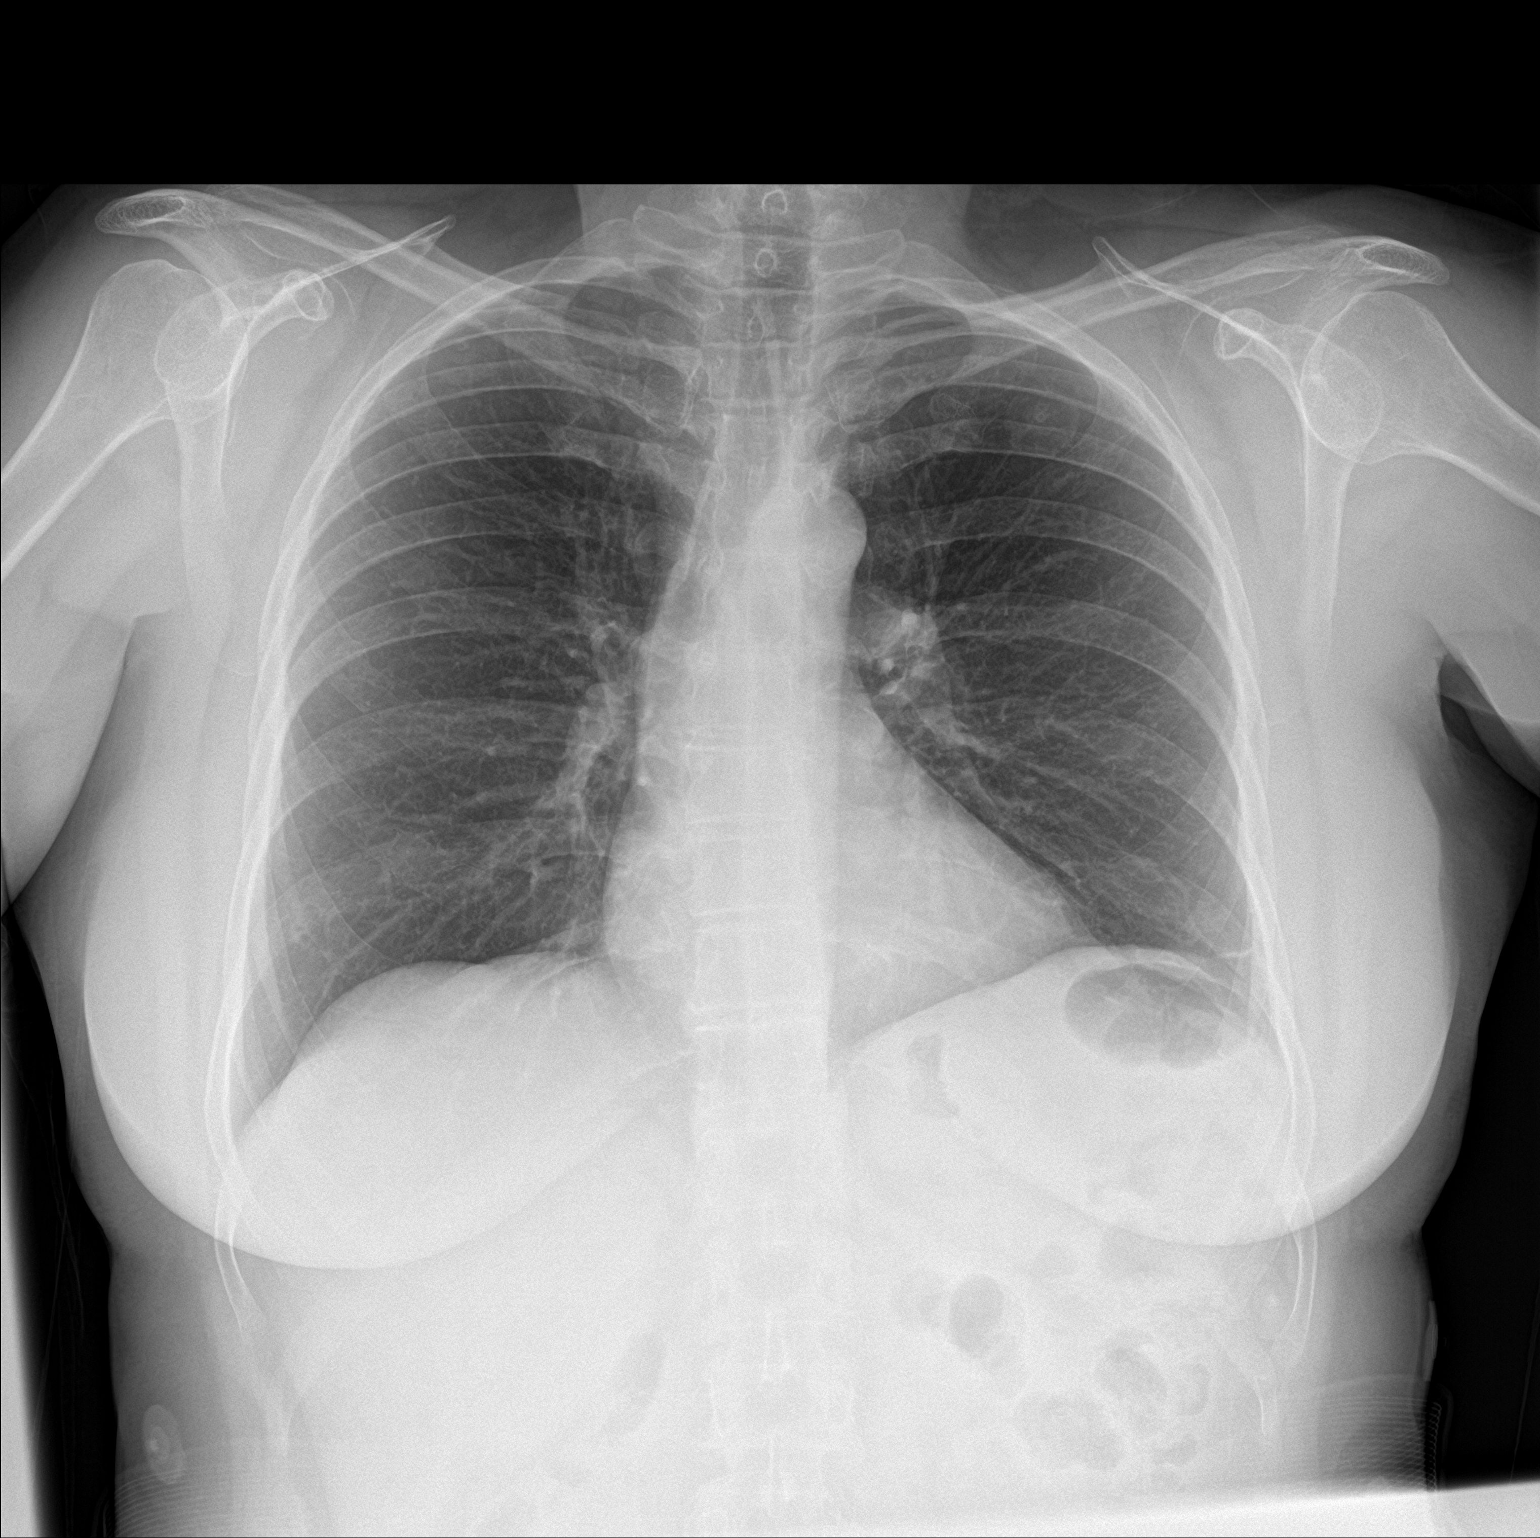

[chest lat]
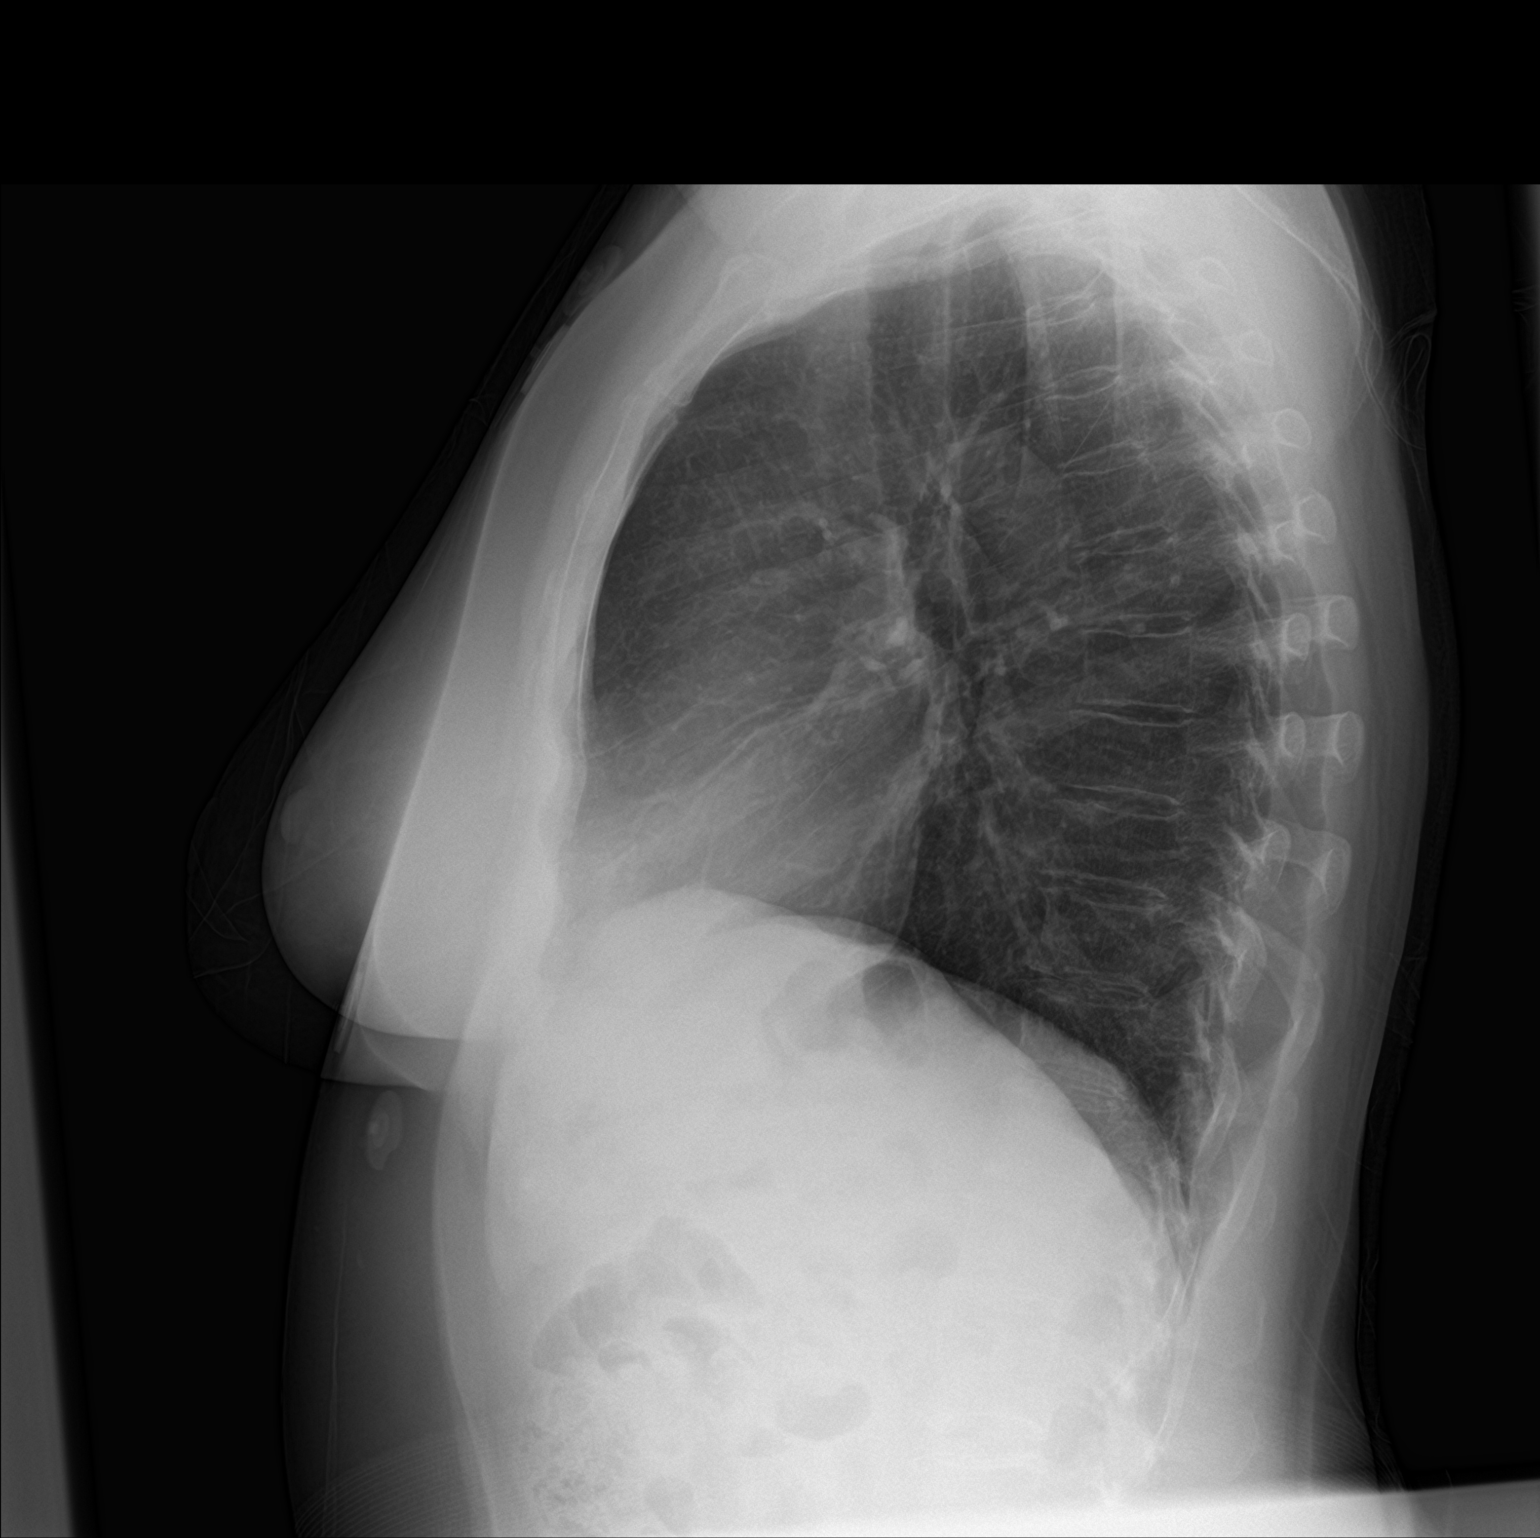

[2 of 2 positions shown; findings below may reference images not displayed]

FINDINGS: Small focus of linear atelectasis or scar periphery of the left lung
base noted. Lungs otherwise clear. Heart size normal. No
pneumothorax or pleural effusion. No acute or focal bony
abnormality.
IMPRESSION: Negative chest.

## 2019-12-24 ENCOUNTER — Ambulatory Visit: Payer: 59

## 2020-01-13 ENCOUNTER — Other Ambulatory Visit: Payer: Self-pay

## 2020-01-13 ENCOUNTER — Ambulatory Visit
Admission: RE | Admit: 2020-01-13 | Discharge: 2020-01-13 | Disposition: A | Discharge status: Home / Self Care | Payer: 59 | Source: Ambulatory Visit | Attending: Internal Medicine | Admitting: Internal Medicine

## 2020-01-13 DIAGNOSIS — Z1231 Encounter for screening mammogram for malignant neoplasm of breast: Secondary | ICD-10-CM

## 2020-02-24 ENCOUNTER — Ambulatory Visit: Payer: 59 | Attending: Internal Medicine

## 2020-08-03 ENCOUNTER — Encounter: Payer: 59 | Admitting: Nurse Practitioner

## 2020-08-24 ENCOUNTER — Encounter: Payer: 59 | Admitting: Nurse Practitioner

## 2020-08-31 ENCOUNTER — Other Ambulatory Visit: Payer: Self-pay

## 2020-08-31 ENCOUNTER — Ambulatory Visit (INDEPENDENT_AMBULATORY_CARE_PROVIDER_SITE_OTHER): Payer: 59 | Admitting: *Deleted

## 2020-08-31 DIAGNOSIS — Z23 Encounter for immunization: Secondary | ICD-10-CM | POA: Diagnosis not present

## 2020-09-28 ENCOUNTER — Encounter: Payer: 59 | Admitting: Nurse Practitioner

## 2020-10-31 ENCOUNTER — Encounter: Payer: 59 | Admitting: Nurse Practitioner

## 2020-10-31 DIAGNOSIS — Z0289 Encounter for other administrative examinations: Secondary | ICD-10-CM

## 2020-11-16 HISTORY — PX: HAND SURGERY: SHX662

## 2020-12-05 ENCOUNTER — Encounter: Payer: 59 | Admitting: Nurse Practitioner

## 2020-12-09 DIAGNOSIS — S42009A Fracture of unspecified part of unspecified clavicle, initial encounter for closed fracture: Secondary | ICD-10-CM

## 2020-12-09 HISTORY — DX: Fracture of unspecified part of unspecified clavicle, initial encounter for closed fracture: S42.009A

## 2020-12-28 ENCOUNTER — Other Ambulatory Visit: Payer: Self-pay | Admitting: Nurse Practitioner

## 2020-12-28 DIAGNOSIS — Z1231 Encounter for screening mammogram for malignant neoplasm of breast: Secondary | ICD-10-CM

## 2021-01-04 ENCOUNTER — Encounter: Payer: Self-pay | Admitting: Nurse Practitioner

## 2021-01-04 ENCOUNTER — Encounter: Payer: 59 | Admitting: Nurse Practitioner

## 2021-01-04 ENCOUNTER — Other Ambulatory Visit: Payer: Self-pay

## 2021-01-04 ENCOUNTER — Ambulatory Visit (INDEPENDENT_AMBULATORY_CARE_PROVIDER_SITE_OTHER): Payer: 59 | Admitting: Nurse Practitioner

## 2021-01-04 VITALS — BP 120/70 | HR 68 | Resp 16 | Ht 61.25 in | Wt 148.0 lb

## 2021-01-04 DIAGNOSIS — Z01419 Encounter for gynecological examination (general) (routine) without abnormal findings: Secondary | ICD-10-CM

## 2021-01-04 DIAGNOSIS — Z780 Asymptomatic menopausal state: Secondary | ICD-10-CM

## 2021-01-04 DIAGNOSIS — M8580 Other specified disorders of bone density and structure, unspecified site: Secondary | ICD-10-CM

## 2021-01-04 NOTE — Patient Instructions (Signed)
Calcium recommendations: 1200mg  daily. I will contact you after your labs are back to give vitamin D recommendations. Continue current regimen of Vit D until you are contacted.  Health Maintenance for Postmenopausal Women Menopause is a normal process in which your ability to get pregnant comes to an end. This process happens slowly over many months or years, usually between the ages of 55 and 52. Menopause is complete when you have missed your menstrual periods for 12 months. It is important to talk with your health care provider about some of the most common conditions that affect women after menopause (postmenopausal women). These include heart disease, cancer, and bone loss (osteoporosis). Adopting a healthy lifestyle and getting preventive care can help to promote your health and wellness. The actions you take can also lower your chances of developing some of these common conditions. What should I know about menopause? During menopause, you may get a number of symptoms, such as:  Hot flashes. These can be moderate or severe.  Night sweats.  Decrease in sex drive.  Mood swings.  Headaches.  Tiredness.  Irritability.  Memory problems.  Insomnia. Choosing to treat or not to treat these symptoms is a decision that you make with your health care provider. Do I need hormone replacement therapy?  Hormone replacement therapy is effective in treating symptoms that are caused by menopause, such as hot flashes and night sweats.  Hormone replacement carries certain risks, especially as you become older. If you are thinking about using estrogen or estrogen with progestin, discuss the benefits and risks with your health care provider. What is my risk for heart disease and stroke? The risk of heart disease, heart attack, and stroke increases as you age. One of the causes may be a change in the body's hormones during menopause. This can affect how your body uses dietary fats, triglycerides, and  cholesterol. Heart attack and stroke are medical emergencies. There are many things that you can do to help prevent heart disease and stroke. Watch your blood pressure  High blood pressure causes heart disease and increases the risk of stroke. This is more likely to develop in people who have high blood pressure readings, are of African descent, or are overweight.  Have your blood pressure checked: ? Every 3-5 years if you are 51-30 years of age. ? Every year if you are 50 years old or older. Eat a healthy diet  Eat a diet that includes plenty of vegetables, fruits, low-fat dairy products, and lean protein.  Do not eat a lot of foods that are high in solid fats, added sugars, or sodium.   Get regular exercise Get regular exercise. This is one of the most important things you can do for your health. Most adults should:  Try to exercise for at least 150 minutes each week. The exercise should increase your heart rate and make you sweat (moderate-intensity exercise).  Try to do strengthening exercises at least twice each week. Do these in addition to the moderate-intensity exercise.  Spend less time sitting. Even light physical activity can be beneficial. Other tips  Work with your health care provider to achieve or maintain a healthy weight.  Do not use any products that contain nicotine or tobacco, such as cigarettes, e-cigarettes, and chewing tobacco. If you need help quitting, ask your health care provider.  Know your numbers. Ask your health care provider to check your cholesterol and your blood sugar (glucose). Continue to have your blood tested as directed by your health  care provider. Do I need screening for cancer? Depending on your health history and family history, you may need to have cancer screening at different stages of your life. This may include screening for:  Breast cancer.  Cervical cancer.  Lung cancer.  Colorectal cancer. What is my risk for  osteoporosis? After menopause, you may be at increased risk for osteoporosis. Osteoporosis is a condition in which bone destruction happens more quickly than new bone creation. To help prevent osteoporosis or the bone fractures that can happen because of osteoporosis, you may take the following actions:  If you are 74-37 years old, get at least 1,000 mg of calcium and at least 600 mg of vitamin D per day.  If you are older than age 70 but younger than age 19, get at least 1,200 mg of calcium and at least 600 mg of vitamin D per day.  If you are older than age 81, get at least 1,200 mg of calcium and at least 800 mg of vitamin D per day. Smoking and drinking excessive alcohol increase the risk of osteoporosis. Eat foods that are rich in calcium and vitamin D, and do weight-bearing exercises several times each week as directed by your health care provider. How does menopause affect my mental health? Depression may occur at any age, but it is more common as you become older. Common symptoms of depression include:  Low or sad mood.  Changes in sleep patterns.  Changes in appetite or eating patterns.  Feeling an overall lack of motivation or enjoyment of activities that you previously enjoyed.  Frequent crying spells. Talk with your health care provider if you think that you are experiencing depression. General instructions See your health care provider for regular wellness exams and vaccines. This may include:  Scheduling regular health, dental, and eye exams.  Getting and maintaining your vaccines. These include: ? Influenza vaccine. Get this vaccine each year before the flu season begins. ? Pneumonia vaccine. ? Shingles vaccine. ? Tetanus, diphtheria, and pertussis (Tdap) booster vaccine. Your health care provider may also recommend other immunizations. Tell your health care provider if you have ever been abused or do not feel safe at home. Summary  Menopause is a normal process in  which your ability to get pregnant comes to an end.  This condition causes hot flashes, night sweats, decreased interest in sex, mood swings, headaches, or lack of sleep.  Treatment for this condition may include hormone replacement therapy.  Take actions to keep yourself healthy, including exercising regularly, eating a healthy diet, watching your weight, and checking your blood pressure and blood sugar levels.  Get screened for cancer and depression. Make sure that you are up to date with all your vaccines. This information is not intended to replace advice given to you by your health care provider. Make sure you discuss any questions you have with your health care provider. Document Revised: 11/18/2018 Document Reviewed: 11/18/2018 Elsevier Patient Education  2021 [employer].

## 2021-01-04 NOTE — Progress Notes (Signed)
60 y.o. [id number] Married White or Caucasian female here for annual exam.      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 Mother had history of very severe osteoporosis and was disfigured. She started testing early because of a screening program at church.  Patient's last menstrual period was 12/09/1996.          Sexually active: Yes.    The current method of family planning is status post hysterectomy.    Exercising: Yes.    some walking Smoker:  no  Health Maintenance: Pap:  2019 neg History of abnormal Pap:  no MMG:  01-14-2020 category b density birads 1:neg Colonoscopy:  2019 f/u 60yrs per patient BMD:   10-14-2019 osteoporosis TDaP:  2015 Gardasil:   n/a Covid-19: pfizer Hep C testing: not done Screening Labs:    reports that she has never smoked. She has never used smokeless tobacco. She reports previous alcohol use. She reports that she does not use drugs.  Past Medical History:  Diagnosis Date  . Allergic rhinitis   . Anxiety   . Arthritis   . Asthma   . GERD (gastroesophageal reflux disease)   . IBS (irritable bowel syndrome)   . Insomnia   . Osteoarthritis   . Osteoporosis 10/2019   T score -2.6 DEXA 2011 with T score -2.6, follow-up DEXA 2020 T score -2.1  . Plantar fasciitis   . Restless leg     Past Surgical History:  Procedure Laterality Date  . APPENDECTOMY    . ARTERY BIOPSY Left 10/08/2018   Procedure: BIOPSY LEFT  TEMPORAL ARTERY;  Surgeon: Abigail Miyamoto, MD;  Location: [facility];  Service: General;  Laterality: Left;   ERAS PATHWAY  . bladder distention  2009  . BREAST EXCISIONAL BIOPSY Bilateral   . BREAST LUMPECTOMY     Benign-Bilateral  . CALCANEAL OSTEOTOMY Right 01/01/2018   Procedure: Right Lateral Calcaneal Osteotomy;  Surgeon: Toni Arthurs, MD;  Location: [facility];  Service: Orthopedics;  Laterality: Right;  . HAND SURGERY Left 11/16/2020  . KNEE SURGERY   04,05  . KNEE SURGERY  2016  . METATARSAL OSTEOTOMY Right 01/01/2018   Procedure: Dorsiflexion Osteotomy of the First Metatarsal; Plantar Fascia Release;  Surgeon: Toni Arthurs, MD;  Location:  SURGERY CENTER;  Service: Orthopedics;  Laterality: Right;  . OOPHORECTOMY     laparoscopic BSO for benign cysts  . REFRACTIVE SURGERY  2016  . REPAIR OF RECTOCELE    . TENDON TRANSFER Right 01/01/2018   Procedure: Right Peroneus Longus Tendon Debridement and Tenolysis; Transfer of Peroneus Brevis to Peroneus Longus Tendon;  Surgeon: Toni Arthurs, MD;  Location:  SURGERY CENTER;  Service: Orthopedics;  Laterality: Right;  Marland Kitchen VAGINAL HYSTERECTOMY     early 2000's for uterine prolapse and rectocele    Current Outpatient Medications  Medication Sig Dispense Refill  . ALPRAZolam (XANAX) 0.25 MG tablet 1 tablet    . BREO ELLIPTA 200-25 MCG/INH AEPB Inhale 1 puff into the lungs daily.    . cetirizine (ZYRTEC) 10 MG tablet Take 10 mg by mouth daily.    . montelukast (SINGULAIR) 10 MG tablet Take 10 mg by mouth daily.    . phentermine 30 MG capsule Take 30 mg by mouth at bedtime.    . pramipexole (MIRAPEX) 0.25 MG tablet Take 0.25 mg by mouth 3 (three) times daily.    . RABEprazole (ACIPHEX) 20 MG tablet Take 20 mg  by mouth daily.    Marland Kitchen topiramate (TOPAMAX) 100 MG tablet Take 100 mg by mouth 2 (two) times daily.    Marland Kitchen venlafaxine XR (EFFEXOR-XR) 75 MG 24 hr capsule Take 75 mg by mouth daily with breakfast.    . Vitamin D, Ergocalciferol, (DRISDOL) 1.25 MG (50000 UT) CAPS capsule Take 1 capsule (50,000 Units total) by mouth every 7 (seven) days. 5 capsule 3   No current facility-administered medications for this visit.    Family History  Problem Relation Age of Onset  . Hypertension Mother   . Heart failure Mother   . Diabetes Father   . Hypertension Father   . Heart disease Father   . Cancer Father        Prostate  . Hypertension Sister   . Cancer Sister        Melanoma  .  Heart disease Paternal Uncle   . Diabetes Paternal Grandmother   . Heart disease Paternal Grandmother   . Diabetes Maternal Grandmother   . Heart disease Maternal Grandmother   . Diabetes Maternal Grandfather   . Heart disease Maternal Grandfather   . Diabetes Paternal Grandfather   . Heart disease Paternal Grandfather     Review of Systems  Constitutional: Negative.   HENT: Negative.   Eyes: Negative.   Respiratory: Negative.   Cardiovascular: Negative.   Gastrointestinal: Negative.   Endocrine: Negative.   Genitourinary: Negative.   Musculoskeletal: Negative.   Skin: Negative.   Allergic/Immunologic: Negative.   Neurological: Negative.   Hematological: Negative.   Psychiatric/Behavioral: Negative.     Exam:   BP 120/70   Pulse 68   Resp 16   Ht 5' 1.25" (1.556 m)   Wt 148 lb (67.1 kg)   LMP 12/09/1996   BMI 27.74 kg/m   Height: 5' 1.25" (155.6 cm)  General appearance: alert, cooperative and appears stated age, no acute distress Head: Normocephalic, without obvious abnormality Neck: no adenopathy, thyroid normal to inspection and palpation Lungs: clear to auscultation bilaterally Breasts: No axillary or supraclavicular adenopathy, Normal to palpation without dominant masses Dense tissue. Heart: regular rate and rhythm Abdomen: soft, non-tender; no masses,  no organomegaly Extremities: extremities normal, no edema Skin: No rashes or lesions Lymph nodes: Cervical, supraclavicular, and axillary nodes normal. No abnormal inguinal nodes palpated Neurologic: Grossly normal   Pelvic: External genitalia:  no lesions              Urethra:  normal appearing urethra with no masses, tenderness or lesions              Bartholins and Skenes: normal                 Vagina: normal appearing vagina, appropriate for age, normal appearing discharge, no lesions              Cervix: absent             Bimanual Exam:   Uterus:  uterus absent              Adnexa: no mass,  fullness, tenderness and s/p oophectomy   Rectal: no palpable mass                 Joy, CMA Chaperone was present for exam.  A:   Well woman exam  Osteopenia after menopause - Plan: VITAMIN D 25 Hydroxy (Vit-D Deficiency, Fractures), Comprehensive metabolic panel, Lipid Profile, CBC    P:   Pap : last pap 2019 (no HPV  test), collected today  Mammogram: scheduled for 02/08/2021  Labs: as above  Medications: discussed Calcium and Vitamin D (currently taking 50,000 iu weekly and has been x several months.  Dexa: due 10/2021

## 2021-01-06 ENCOUNTER — Other Ambulatory Visit: Payer: Self-pay | Admitting: Nurse Practitioner

## 2021-01-06 DIAGNOSIS — Z8639 Personal history of other endocrine, nutritional and metabolic disease: Secondary | ICD-10-CM

## 2021-01-06 NOTE — Progress Notes (Signed)
Pt has osteopenia with strong family history of osteoporosis. Hx of vitamin D deficiency. Was taking extended regimen of 50,000 IU weekly and level increased to 70. Recommended to d/c 50,000 iu weekly and decrease to 1000 iu daily (reference Up to Date).  Will monitor to make sure supplementation at that level is optimal for bones. Recommend re-screening vitamin D in 6 months. Future order placed. [place], [facility]

## 2021-01-09 LAB — LIPID PANEL
Cholesterol: 218 mg/dL — ABNORMAL HIGH (ref <200)
HDL: 69 mg/dL (ref >=50)
LDL Cholesterol (Calc): 128 mg/dL (calc) — ABNORMAL HIGH
Non-HDL Cholesterol (Calc): 149 mg/dL (calc) — ABNORMAL HIGH (ref <130)
Total CHOL/HDL Ratio: 3.2 (calc) (ref <5.0)
Triglycerides: 100 mg/dL (ref <150)

## 2021-01-09 LAB — HEPATITIS C ANTIBODY
Hepatitis C Ab: NONREACTIVE
SIGNAL TO CUT-OFF: 0.01 (ref <1.00)

## 2021-01-09 LAB — COMPREHENSIVE METABOLIC PANEL
AG Ratio: 1.8 (calc) (ref 1.0–2.5)
ALT: 16 U/L (ref 6–29)
AST: 17 U/L (ref 10–35)
Albumin: 4.4 g/dL (ref 3.6–5.1)
Alkaline phosphatase (APISO): 85 U/L (ref 37–153)
BUN: 17 mg/dL (ref 7–25)
CO2: 25 mmol/L (ref 20–32)
Calcium: 10 mg/dL (ref 8.6–10.4)
Chloride: 105 mmol/L (ref 98–110)
Creat: 0.96 mg/dL (ref 0.50–0.99)
Globulin: 2.5 g/dL (calc) (ref 1.9–3.7)
Glucose, Bld: 79 mg/dL (ref 65–99)
Potassium: 4.1 mmol/L (ref 3.5–5.3)
Sodium: 138 mmol/L (ref 135–146)
Total Bilirubin: 0.3 mg/dL (ref 0.2–1.2)
Total Protein: 6.9 g/dL (ref 6.1–8.1)

## 2021-01-09 LAB — CBC
HCT: 45.1 % — ABNORMAL HIGH (ref 35.0–45.0)
Hemoglobin: 14.8 g/dL (ref 11.7–15.5)
MCH: 29 pg (ref 27.0–33.0)
MCHC: 32.8 g/dL (ref 32.0–36.0)
MCV: 88.3 fL (ref 80.0–100.0)
MPV: 9.5 fL (ref 7.5–12.5)
Platelets: 340 10*3/uL (ref 140–400)
RBC: 5.11 10*6/uL — ABNORMAL HIGH (ref 3.80–5.10)
RDW: 12 % (ref 11.0–15.0)
WBC: 6.8 10*3/uL (ref 3.8–10.8)

## 2021-01-09 LAB — VITAMIN D 25 HYDROXY (VIT D DEFICIENCY, FRACTURES): Vit D, 25-Hydroxy: 70 ng/mL (ref 30–100)

## 2021-02-08 ENCOUNTER — Other Ambulatory Visit: Payer: Self-pay

## 2021-02-08 ENCOUNTER — Ambulatory Visit
Admission: RE | Admit: 2021-02-08 | Discharge: 2021-02-08 | Disposition: A | Discharge status: Home / Self Care | Payer: 59 | Source: Ambulatory Visit | Attending: Nurse Practitioner | Admitting: Nurse Practitioner

## 2021-02-08 DIAGNOSIS — Z1231 Encounter for screening mammogram for malignant neoplasm of breast: Secondary | ICD-10-CM

## 2021-12-02 ENCOUNTER — Encounter (HOSPITAL_BASED_OUTPATIENT_CLINIC_OR_DEPARTMENT_OTHER): Payer: Self-pay

## 2021-12-02 ENCOUNTER — Other Ambulatory Visit: Payer: Self-pay

## 2021-12-02 ENCOUNTER — Emergency Department (HOSPITAL_BASED_OUTPATIENT_CLINIC_OR_DEPARTMENT_OTHER): Payer: 59 | Admitting: Radiology

## 2021-12-02 ENCOUNTER — Emergency Department (HOSPITAL_BASED_OUTPATIENT_CLINIC_OR_DEPARTMENT_OTHER)
Admission: EM | Admit: 2021-12-02 | Discharge: 2021-12-02 | Disposition: A | Discharge status: Home / Self Care | Payer: 59 | Attending: Emergency Medicine | Admitting: Emergency Medicine

## 2021-12-02 DIAGNOSIS — Z7951 Long term (current) use of inhaled steroids: Secondary | ICD-10-CM | POA: Diagnosis not present

## 2021-12-02 DIAGNOSIS — S6991XA Unspecified injury of right wrist, hand and finger(s), initial encounter: Secondary | ICD-10-CM | POA: Diagnosis present

## 2021-12-02 DIAGNOSIS — W01198A Fall on same level from slipping, tripping and stumbling with subsequent striking against other object, initial encounter: Secondary | ICD-10-CM | POA: Insufficient documentation

## 2021-12-02 DIAGNOSIS — S52131A Displaced fracture of neck of right radius, initial encounter for closed fracture: Secondary | ICD-10-CM | POA: Diagnosis not present

## 2021-12-02 DIAGNOSIS — J45909 Unspecified asthma, uncomplicated: Secondary | ICD-10-CM | POA: Diagnosis not present

## 2021-12-02 DIAGNOSIS — M79602 Pain in left arm: Secondary | ICD-10-CM | POA: Diagnosis not present

## 2021-12-02 DIAGNOSIS — W19XXXA Unspecified fall, initial encounter: Secondary | ICD-10-CM

## 2021-12-02 NOTE — ED Provider Notes (Signed)
[facility] EMERGENCY DEPT Provider Note   CSN: [id number] Arrival date & time: 12/02/21  1532     History Chief Complaint  Patient presents with   Marletta Lor    [name] is a 60 y.o. female with history of osteoporosis who presents emergency department complaining of right elbow and left arm pain after fall about 30 minutes prior to ED arrival.  Patient had a previous fall several weeks ago, and has a known fracture of her right radial head and her left clavicle.  She states she was not wearing her right elbow brace when she fell, and she now has increased pain and swelling in that area.  She states that she was going down a ramp, and slipped and fell backwards hitting her bilateral elbows on the ground.  She denies any other trauma or loss of consciousness.   Fall      Past Medical History:  Diagnosis Date   Allergic rhinitis    Anxiety    Arthritis    Asthma    GERD (gastroesophageal reflux disease)    IBS (irritable bowel syndrome)    Insomnia    Osteoarthritis    Osteoporosis 10/2019   T score -2.6 DEXA 2011 with T score -2.6, follow-up DEXA 2020 T score -2.1   Plantar fasciitis    Restless leg     Patient Active Problem List   Diagnosis Date Noted   Hypersomnia, persistent 10/29/2017   Vivid dream 10/29/2017   RLS (restless legs syndrome) 10/29/2017   Mixed obsessional thoughts and acts 10/29/2017   Compulsive buying 10/29/2017   Compulsive eating patterns 10/29/2017   GERD (gastroesophageal reflux disease) 08/24/2013   Acute bronchitis 08/24/2013   Osteoporosis    Unspecified vitamin D deficiency    ALLERGIC CONJUNCTIVITIS 05/05/2008   NASAL POLYP 05/05/2008   Seasonal allergic rhinitis 05/05/2008   Pneumothorax 05/05/2008   BREAST MASS, BENIGN 05/05/2008    Past Surgical History:  Procedure Laterality Date   APPENDECTOMY     ARTERY BIOPSY Left 10/08/2018   Procedure: BIOPSY LEFT  TEMPORAL ARTERY;  Surgeon: Abigail Miyamoto, MD;   Location: [facility];  Service: General;  Laterality: Left;   ERAS PATHWAY   bladder distention  2009   BREAST EXCISIONAL BIOPSY Bilateral    BREAST LUMPECTOMY     Benign-Bilateral   CALCANEAL OSTEOTOMY Right 01/01/2018   Procedure: Right Lateral Calcaneal Osteotomy;  Surgeon: Toni Arthurs, MD;  Location: [facility];  Service: Orthopedics;  Laterality: Right;   HAND SURGERY Left 11/16/2020   KNEE SURGERY  04,05   KNEE SURGERY  2016   METATARSAL OSTEOTOMY Right 01/01/2018   Procedure: Dorsiflexion Osteotomy of the First Metatarsal; Plantar Fascia Release;  Surgeon: Toni Arthurs, MD;  Location: [facility];  Service: Orthopedics;  Laterality: Right;   OOPHORECTOMY     laparoscopic BSO for benign cysts   REFRACTIVE SURGERY  2016   REPAIR OF RECTOCELE     TENDON TRANSFER Right 01/01/2018   Procedure: Right Peroneus Longus Tendon Debridement and Tenolysis; Transfer of Peroneus Brevis to Peroneus Longus Tendon;  Surgeon: Toni Arthurs, MD;  Location: [facility];  Service: Orthopedics;  Laterality: Right;   VAGINAL HYSTERECTOMY     early 2000's for uterine prolapse and rectocele     OB History     Gravida  3   Para  2   Term  2   Preterm      AB  1  Living  2      SAB      IAB      Ectopic      Multiple      Live Births              Family History  Problem Relation Age of Onset   Hypertension Mother    Heart failure Mother    Diabetes Father    Hypertension Father    Heart disease Father    Cancer Father        Prostate   Hypertension Sister    Cancer Sister        Melanoma   Heart disease Paternal Uncle    Diabetes Paternal Grandmother    Heart disease Paternal Grandmother    Diabetes Maternal Grandmother    Heart disease Maternal Grandmother    Diabetes Maternal Grandfather    Heart disease Maternal Grandfather    Diabetes Paternal Grandfather    Heart disease Paternal Grandfather     Social  History   Tobacco Use   Smoking status: Never   Smokeless tobacco: Never  Vaping Use   Vaping Use: Never used  Substance Use Topics   Alcohol use: Not Currently    Alcohol/week: 0.0 standard drinks   Drug use: No    Home Medications Prior to Admission medications   Medication Sig Start Date End Date Taking? Authorizing Provider  ALPRAZolam Prudy Feeler) 0.25 MG tablet 1 tablet 08/25/20   [provider]  BREO ELLIPTA 200-25 MCG/INH AEPB Inhale 1 puff into the lungs daily. 12/21/20   [provider]  cetirizine (ZYRTEC) 10 MG tablet Take 10 mg by mouth daily. 08/22/20   [provider]  montelukast (SINGULAIR) 10 MG tablet Take 10 mg by mouth daily. 11/16/20   [provider]  phentermine 30 MG capsule Take 30 mg by mouth at bedtime. 12/28/20   [provider]  pramipexole (MIRAPEX) 0.25 MG tablet Take 0.25 mg by mouth 3 (three) times daily.    [provider]  RABEprazole (ACIPHEX) 20 MG tablet Take 20 mg by mouth daily.    [provider]  topiramate (TOPAMAX) 100 MG tablet Take 100 mg by mouth 2 (two) times daily.    [provider]  venlafaxine XR (EFFEXOR-XR) 75 MG 24 hr capsule Take 75 mg by mouth daily with breakfast.    [provider]  Vitamin D, Ergocalciferol, (DRISDOL) 1.25 MG (50000 UT) CAPS capsule Take 1 capsule (50,000 Units total) by mouth every 7 (seven) days. 10/19/19   Fontaine, Nadyne Coombes, MD    Allergies    Buspirone, Cephalexin, Clonazepam, Decongestant [pseudoephedrine hcl er], Erythromycin, Insulins, Lorazepam, Neomycin-bacitracin zn-polymyx, Other, Petrol dist-piperonyl butoxide-pyrethrins [pyrethrins-piperonyl butoxide], Pramipexole dihydrochloride, Promethazine hcl, Restasis [cyclosporine], Sertraline hcl, Bacitracin-neomycin-polymyxin, and Codeine  Review of Systems   Review of Systems  Musculoskeletal:        Right elbow and left arm pain  Skin:  Negative for wound.  Neurological:   Negative for numbness.  All other systems reviewed and are negative.  Physical Exam Updated Vital Signs BP 136/78 (BP Location: Left Arm)    Pulse 88    Temp 98.3 F (36.8 C) (Oral)    Resp 16    Ht 5\' 1"  (1.549 m)    Wt 66.7 kg    LMP 12/09/1996    SpO2 100%    BMI 27.78 kg/m   Physical Exam Vitals and nursing note reviewed.  Constitutional:      Appearance:  Normal appearance.  HENT:     Head: Normocephalic and atraumatic.  Eyes:     Conjunctiva/sclera: Conjunctivae normal.  Pulmonary:     Effort: Pulmonary effort is normal. No respiratory distress.  Musculoskeletal:     Comments: Patient has full passive ROM of all extremities.  There is some pain with hyperflexion of the right elbow, as well as 4/5 grip strength in the right hand due to pain.  5/5 strength in all other extremities.  No palpable deformities.  There is a small abrasion over the right olecranon, without bleeding.  No ecchymoses or effusions noted.  Sensation intact in bilateral upper extremities.  Pulses equal and normal bilaterally.  Skin:    General: Skin is warm and dry.     Capillary Refill: Capillary refill takes less than 2 seconds.  Neurological:     Mental Status: She is alert.  Psychiatric:        Mood and Affect: Mood normal.        Behavior: Behavior normal.    ED Results / Procedures / Treatments   Labs (all labs ordered are listed, but only abnormal results are displayed) Labs Reviewed - No data to display  EKG None  Radiology DG Clavicle Left  Result Date: 12/02/2021 CLINICAL DATA:  Fall from standing position 30 minutes ago, additional recent fall with known RIGHT radial head and LEFT clavicular fractures EXAM: LEFT CLAVICLE - 2+ VIEWS COMPARISON:  None FINDINGS: Osseous demineralization. Sternoclavicular and acromioclavicular joint alignments normal. Visualized LEFT ribs intact. No definite clavicular fracture or bone destruction identified. IMPRESSION: Osseous demineralization without  definite acute abnormalities. Electronically Signed   By: Ulyses Southward M.D.   On: 12/02/2021 17:00   DG Elbow Complete Right  Result Date: 12/02/2021 CLINICAL DATA:  Larey Seat from standing position 30 minutes ago, recent additional fracture with known radial head fracture and LEFT clavicular fracture EXAM: RIGHT ELBOW - COMPLETE 3+ VIEW COMPARISON:  None FINDINGS: Osseous demineralization. Joint spaces preserved. Subacute impacted RIGHT radial neck fracture, nondisplaced, with ill-defined margins and mild sclerosis as well as small amount of periosteal new bone. No acute fracture, dislocation, or bone destruction. Minimal joint effusion. IMPRESSION: Subacute impacted RIGHT radial neck fracture with minimal joint effusion. No additional osseous abnormalities. Electronically Signed   By: Ulyses Southward M.D.   On: 12/02/2021 16:58   DG Humerus Left  Result Date: 12/02/2021 CLINICAL DATA:  Larey Seat from standing position 30 minutes ago, injury EXAM: LEFT HUMERUS - 2+ VIEW COMPARISON:  None FINDINGS: Osseous demineralization. LEFT elbow and shoulder joint alignments normal. AC joint alignment normal. No acute fracture, dislocation, or bone destruction. IMPRESSION: No acute osseous abnormalities. Electronically Signed   By: Ulyses Southward M.D.   On: 12/02/2021 17:02    Procedures Procedures   Medications Ordered in ED Medications - No data to display  ED Course  I have reviewed the triage vital signs and the nursing notes.  Pertinent labs & imaging results that were available during my care of the patient were reviewed by me and considered in my medical decision making (see chart for details).    MDM Rules/Calculators/A&P                          Patient is a 60 year old female with history of osteoporosis who presents the emergency department complaining of right elbow and left arm pain after a fall about 30 minutes prior to ED arrival.  She has a known right radial  head fracture and a left clavicle fracture,  she has concerns today about reinjuring these areas.  Exam patient is neurovascular intact in her bilateral upper extremities.  She has mildly decreased grip strength in the right hand due to pain.  There is no gross deformities on my exam, and patient has good range of motion of all her extremities.  X-ray performed of the right elbow, left clavicle, and left humerus shows no acute fractures or dislocations.  Did comment on the prior fractures we already knew about.  Discussed results with patient.  I do not believe she requires admission or inpatient treatment, and we will treat her injury symptomatically outpatient.  Discussed reasons to return to the emergency department, patient agreeable to plan.  Final Clinical Impression(s) / ED Diagnoses Final diagnoses:  Fall, initial encounter    Rx / DC Orders ED Discharge Orders     None      Portions of this report may have been transcribed using voice recognition software. Every effort was made to ensure accuracy; however, inadvertent computerized transcription errors may be present.    Jeanella Flattery 12/02/21 1729    Tegeler, Canary Brim, MD 12/03/21 [phone]

## 2021-12-02 NOTE — ED Triage Notes (Signed)
Pt states she had a fall from standing approximately 30 minutes prior to arrival. Pt recently had another fall and has known fx R radial head and L clavicle. Denies head injury/LOC. Pt states she was not wearing her arm brace when she fell and now has increased pain and swelling in her R elbow area. Pt also states she had fever a few days ago and still has nausea and diarrhea.

## 2021-12-02 NOTE — Discharge Instructions (Addendum)
You were seen in the emergency department today after a fall.  As we discussed we got x-rays of your right elbow, your left clavicle, and your left humerus.  There are no new fractures, the only thing the radiologist commented on was the fractures we already knew about.  In terms of your pain, you can continue taking over-the-counter medications at home. It has been a pleasure seeing and caring for you today and I hope you start feeling better soon!

## 2021-12-02 NOTE — ED Notes (Signed)
Patient transported to X-ray 

## 2022-01-11 ENCOUNTER — Other Ambulatory Visit: Payer: Self-pay | Admitting: Nurse Practitioner

## 2022-01-11 DIAGNOSIS — Z1231 Encounter for screening mammogram for malignant neoplasm of breast: Secondary | ICD-10-CM

## 2022-02-08 ENCOUNTER — Ambulatory Visit
Admission: RE | Admit: 2022-02-08 | Discharge: 2022-02-08 | Disposition: A | Discharge status: Home / Self Care | Payer: 59 | Source: Ambulatory Visit | Attending: Nurse Practitioner | Admitting: Nurse Practitioner

## 2022-02-08 DIAGNOSIS — Z1231 Encounter for screening mammogram for malignant neoplasm of breast: Secondary | ICD-10-CM

## 2022-02-18 ENCOUNTER — Ambulatory Visit
Admission: RE | Admit: 2022-02-18 | Discharge: 2022-02-18 | Disposition: A | Discharge status: Home / Self Care | Payer: 59 | Source: Ambulatory Visit | Attending: Nurse Practitioner | Admitting: Nurse Practitioner

## 2022-03-11 ENCOUNTER — Encounter: Payer: 59 | Admitting: Obstetrics and Gynecology

## 2022-04-23 ENCOUNTER — Encounter: Payer: Self-pay | Admitting: Obstetrics & Gynecology

## 2022-04-23 ENCOUNTER — Ambulatory Visit (INDEPENDENT_AMBULATORY_CARE_PROVIDER_SITE_OTHER): Payer: 59 | Admitting: Obstetrics & Gynecology

## 2022-04-23 ENCOUNTER — Other Ambulatory Visit (HOSPITAL_COMMUNITY)
Admission: RE | Admit: 2022-04-23 | Discharge: 2022-04-23 | Disposition: A | Discharge status: Home / Self Care | Payer: 59 | Source: Ambulatory Visit | Attending: Obstetrics & Gynecology | Admitting: Obstetrics & Gynecology

## 2022-04-23 VITALS — BP 124/80 | HR 77 | Resp 16 | Ht 60.25 in | Wt 148.0 lb

## 2022-04-23 DIAGNOSIS — Z01419 Encounter for gynecological examination (general) (routine) without abnormal findings: Secondary | ICD-10-CM | POA: Insufficient documentation

## 2022-04-23 DIAGNOSIS — Z90722 Acquired absence of ovaries, bilateral: Secondary | ICD-10-CM

## 2022-04-23 DIAGNOSIS — M810 Age-related osteoporosis without current pathological fracture: Secondary | ICD-10-CM | POA: Diagnosis not present

## 2022-04-23 DIAGNOSIS — Z1272 Encounter for screening for malignant neoplasm of vagina: Secondary | ICD-10-CM

## 2022-04-23 DIAGNOSIS — N9089 Other specified noninflammatory disorders of vulva and perineum: Secondary | ICD-10-CM

## 2022-04-23 DIAGNOSIS — Z780 Asymptomatic menopausal state: Secondary | ICD-10-CM

## 2022-04-23 DIAGNOSIS — Z9079 Acquired absence of other genital organ(s): Secondary | ICD-10-CM

## 2022-04-23 DIAGNOSIS — Z90710 Acquired absence of both cervix and uterus: Secondary | ICD-10-CM | POA: Diagnosis not present

## 2022-04-23 DIAGNOSIS — R309 Painful micturition, unspecified: Secondary | ICD-10-CM | POA: Diagnosis not present

## 2022-04-23 MED ORDER — CLOBETASOL PROPIONATE 0.05 % EX OINT: 1.0000 "application " | TOPICAL_OINTMENT | Freq: Every day | CUTANEOUS | 1 refills | Status: AC

## 2022-04-23 NOTE — Progress Notes (Signed)
? ? ?[name] 1961/09/15 [id number] ? ? ?History:    61 y.o. G3P2A1L2 Married.  Physiological scientist for [employer] ?  ?RP:  Established patient presenting for annual gyn exam  ? ?HPI: S/P Vaginal Hysterectomy for prolapse, then BSO for benign ovarian cysts.  Postmenopause, well on no HRT.  Previously took Estrogen HRT x 5-6 yrs.  No PMB.  No pelvic pain. Mild burning with urination.  No other UTI Sx.  Small bumps on the vulva. BMs normal.  Sexually active.  Pap Neg in 07/2018, no h/o abnormal Pap.  Pap reflex today.  Breasts normal.  Mammo Neg 02/2022. Bone Density in 10/2019 showing Osteoporosis.  Took Reclast x 2 injections a few years ago.  Hx vitamin D deficiency.  Walking.  Started a fitness program recently. BMI 28.66. COLONOSCOPY: 2019.  ? ? ?Past medical history,surgical history, family history and social history were all reviewed and documented in the EPIC chart. ? ?Gynecologic History ?Patient's last menstrual period was 12/09/1996. ? ?Obstetric History ?OB History  ?Gravida Para Term Preterm AB Living  ?3 2 2   1 2   ?SAB IAB Ectopic Multiple Live Births  ?1          ?  ?# Outcome Date GA Lbr Len/2nd Weight Sex Delivery Anes PTL Lv  ?3 Term           ?2 Term           ?1 SAB           ? ? ? ?ROS: A ROS was performed and pertinent positives and negatives are included in the history. ? GENERAL: No fevers or chills. HEENT: No change in vision, no earache, sore throat or sinus congestion. NECK: No pain or stiffness. CARDIOVASCULAR: No chest pain or pressure. No palpitations. PULMONARY: No shortness of breath, cough or wheeze. GASTROINTESTINAL: No abdominal pain, nausea, vomiting or diarrhea, melena or bright red blood per rectum. GENITOURINARY: No urinary frequency, urgency, hesitancy or dysuria. MUSCULOSKELETAL: No joint or muscle pain, no back pain, no recent trauma. DERMATOLOGIC: No rash, no itching, no lesions. ENDOCRINE: No polyuria, polydipsia, no heat or cold intolerance. No recent change in weight.  HEMATOLOGICAL: No anemia or easy bruising or bleeding. NEUROLOGIC: No headache, seizures, numbness, tingling or weakness. PSYCHIATRIC: No depression, no loss of interest in normal activity or change in sleep pattern.  ?  ? ?Exam: ? ? ?BP 124/80   Pulse 77   Resp 16   Ht 5' 0.25" (1.53 m)   Wt 148 lb (67.1 kg)   LMP 12/09/1996   BMI 28.66 kg/m?  ? ?Body mass index is 28.66 kg/m?. ? ?General appearance : Well developed well nourished female. No acute distress ?HEENT: Eyes: no retinal hemorrhage or exudates,  Neck supple, trachea midline, no carotid bruits, no thyroidmegaly ?Lungs: Clear to auscultation, no rhonchi or wheezes, or rib retractions  ?Heart: Regular rate and rhythm, no murmurs or gallops ?Breast:Examined in sitting and supine position were symmetrical in appearance, no palpable masses or tenderness,  no skin retraction, no nipple inversion, no nipple discharge, no skin discoloration, no axillary or supraclavicular lymphadenopathy ?Abdomen: no palpable masses or tenderness, no rebound or guarding ?Extremities: no edema or skin discoloration or tenderness ? ?Pelvic: Vulva: Fissure at mid anterior vulva just below the clitoris.  White patch about 1 cm on the left side of the fissure.  Mild whitish atrophy at the posterior fourchette/perineum. ?            Vagina: No  gross lesions or discharge.  Pap reflex done. ? Cervix/Uterus absent ? Adnexa  Without masses or tenderness ? Anus: Normal ? ?U/A:  Completely Negative except RBC 3-10. ? ? ?Assessment/Plan:  61 y.o. female for annual exam  ? ?1. Encounter for Papanicolaou smear of vagina as part of routine gynecological examination ?S/P Vaginal Hysterectomy for prolapse, then BSO for benign ovarian cysts.  Postmenopause, well on no HRT.  Previously took Estrogen HRT x 5-6 yrs.  No PMB.  No pelvic pain. Mild burning with urination.  No other UTI Sx.  Small bumps on the vulva. BMs normal.  Sexually active.  Pap Neg in 07/2018, no h/o abnormal Pap.  Pap reflex  today.  Breasts normal.  Mammo Neg 02/2022. Bone Density in 10/2019 showing Osteoporosis.  Took Reclast x 2 injections a few years ago.  Hx vitamin D deficiency.  Walking.  Started a fitness program recently. BMI 28.66. COLONOSCOPY: 2019.  ?- Cytology - PAP( [facility]) ? ?2. S/P TAH-BSO ? ?3. Postmenopause ?S/P Vaginal Hysterectomy for prolapse, then BSO for benign ovarian cysts.  Postmenopause, well on no HRT.  Previously took Estrogen HRT x 5-6 yrs.  No PMB.  No pelvic pain ?- DG Bone Density; Future ? ?4. Postmenopausal osteoporosis ?Bone Density in 10/2019 showing Osteoporosis.  Took Reclast x 2 injections a few years ago.  Hx vitamin D deficiency.  Walking.  Started a fitness program recently. BMI 28.66. Repeat Bone Density here now.   ?- DG Bone Density; Future ? ?5. Vulvar lesion ?Anterior mid vulvar fissure and small white lesion at the left of the fissure, some early atrophy at the post fourchette/perineum. Possible early Lichen Sclerosus.  Could also be VIN.  Will treat with Clobetasol 0.05% daily x 3-4 weeks.  Usage reviewed.  F/U 3-4 weeks to reassess, possible vulvar biopsy. ? ?6. Pain with urination ?U/A Neg except for 3-10 RBC.  Probably d/t fissure peri-urethra.   ?- Urinalysis,Complete w/RFL Culture ? ?Other orders ?- buPROPion (WELLBUTRIN XL) 150 MG 24 hr tablet; Take 150 mg by mouth every morning. ?- pramipexole (MIRAPEX) 0.5 MG tablet; Take 0.25 mg by mouth at bedtime. ?- clobetasol ointment (TEMOVATE) 0.05 %; Apply 1 application. topically daily. Can start weaning slowly if improving after 2 weeks.  ? ?Genia Del MD, 8:52 AM 04/23/2022 ? ?  ?

## 2022-04-24 LAB — CYTOLOGY - PAP: Diagnosis: NEGATIVE

## 2022-04-24 NOTE — Telephone Encounter (Signed)
Patient has been scheduled 04/26/22 at 2:30pm. ?

## 2022-04-25 LAB — URINALYSIS, COMPLETE W/RFL CULTURE
Bacteria, UA: NONE SEEN /HPF
Bilirubin Urine: NEGATIVE
Casts: NONE SEEN /LPF
Crystals: NONE SEEN /HPF
Glucose, UA: NEGATIVE
Hgb urine dipstick: NEGATIVE
Hyaline Cast: NONE SEEN /LPF
Ketones, ur: NEGATIVE
Leukocyte Esterase: NEGATIVE
Nitrites, Initial: NEGATIVE
Protein, ur: NEGATIVE
Specific Gravity, Urine: 1.015 (ref 1.001–1.035)
WBC, UA: NONE SEEN /HPF (ref 0–5)
Yeast: NONE SEEN /HPF
pH: 7 (ref 5.0–8.0)

## 2022-04-25 LAB — URINE CULTURE
MICRO NUMBER:: 13402147
SPECIMEN QUALITY:: ADEQUATE

## 2022-04-25 LAB — CULTURE INDICATED

## 2022-04-26 ENCOUNTER — Other Ambulatory Visit (HOSPITAL_COMMUNITY)
Admission: RE | Admit: 2022-04-26 | Discharge: 2022-04-26 | Disposition: A | Discharge status: Home / Self Care | Payer: 59 | Source: Ambulatory Visit | Attending: Obstetrics & Gynecology | Admitting: Obstetrics & Gynecology

## 2022-04-26 ENCOUNTER — Encounter: Payer: Self-pay | Admitting: Obstetrics & Gynecology

## 2022-04-26 ENCOUNTER — Ambulatory Visit (INDEPENDENT_AMBULATORY_CARE_PROVIDER_SITE_OTHER): Payer: 59 | Admitting: Obstetrics & Gynecology

## 2022-04-26 VITALS — BP 110/68

## 2022-04-26 DIAGNOSIS — N9089 Other specified noninflammatory disorders of vulva and perineum: Secondary | ICD-10-CM | POA: Insufficient documentation

## 2022-04-26 DIAGNOSIS — N3000 Acute cystitis without hematuria: Secondary | ICD-10-CM | POA: Diagnosis not present

## 2022-04-26 MED ORDER — AMOXICILLIN 500 MG PO TABS: 500.0000 mg | ORAL_TABLET | Freq: Three times a day (TID) | ORAL | 0 refills | Status: AC

## 2022-04-26 NOTE — Progress Notes (Signed)
    [name] 11/08/1961 [id number]        61 y.o.  [id number]   RP: Vulvar lesion for Bx  HPI: Patient seen on 04/23/2022 for her annual gyn exam: An anterior mid vulvar fissure and small white lesion at the left of the fissure were found on exam.  Patient didn't feel comfortable to treat with Clobetasol 0.05% for a month and reevaluate with a possible Bx then, preferred to advance the vulvar Bx to today.   OB History  Gravida Para Term Preterm AB Living  3 2 2   1 2   SAB IAB Ectopic Multiple Live Births  1            # Outcome Date GA Lbr Len/2nd Weight Sex Delivery Anes PTL Lv  3 Term           2 Term           1 SAB             Past medical history,surgical history, problem list, medications, allergies, family history and social history were all reviewed and documented in the EPIC chart.   Directed ROS with pertinent positives and negatives documented in the history of present illness/assessment and plan.  Exam:  Vitals:   04/26/22 1429  BP: 110/68   General appearance:  Normal  Gynecologic exam: Vulva:  Vulva: Fissure at mid anterior vulva just below the clitoris.  White patch about 1 cm on the left side of the fissure.  Mild whitish atrophy at the posterior fourchette/perineum.  Vulvar biopsy:  Informed consent obtained and signed.  Betadine prep.  Local anesthesia with Lido 1%.  Punch #4 biopsy at the left anterior vulva below the clitoris.  Closure with 2 single sutures of Vicryl 4-0.  Good hemostasis.  Well tolerated.  No Cx.  Specimen sent to pathology.  Urine culture 04/23/22: Strep Agalactiae   Assessment/Plan:  61 y.o. [id number]   1. Vulvar lesion Possible early Lichen Sclerosus.  Vulvar Bx pending.  Post procedure precautions reviewed.  Management per Bx results.  If Neg for VIN/Vulvar Ca, will treat with Clobetasol 0.05% ointment twice a week. - Surgical pathology( [place]/ [facility])  2. Acute cystitis without hematuria U. Culture positive for Strep.  Agalactiae.  Will treat with Amoxil 500 mg PO TID x 7 days.  No CI.  Usage reviewed.  Prescription sent to pharmacy.  Other orders - phentermine 30 MG capsule - amoxicillin (AMOXIL) 500 MG tablet; Take 1 tablet (500 mg total) by mouth 3 (three) times daily for 7 days.   Princess Bruins MD, 3:15 PM 04/26/2022

## 2022-04-30 LAB — SURGICAL PATHOLOGY

## 2022-05-04 ENCOUNTER — Encounter: Payer: Self-pay | Admitting: Obstetrics & Gynecology

## 2022-05-15 ENCOUNTER — Ambulatory Visit (INDEPENDENT_AMBULATORY_CARE_PROVIDER_SITE_OTHER): Payer: 59

## 2022-05-15 ENCOUNTER — Other Ambulatory Visit: Payer: Self-pay | Admitting: Obstetrics & Gynecology

## 2022-05-15 DIAGNOSIS — Z780 Asymptomatic menopausal state: Secondary | ICD-10-CM | POA: Diagnosis not present

## 2022-05-15 DIAGNOSIS — M8589 Other specified disorders of bone density and structure, multiple sites: Secondary | ICD-10-CM

## 2022-05-15 DIAGNOSIS — Z13820 Encounter for screening for osteoporosis: Secondary | ICD-10-CM | POA: Diagnosis not present

## 2022-05-15 DIAGNOSIS — M810 Age-related osteoporosis without current pathological fracture: Secondary | ICD-10-CM

## 2022-05-20 ENCOUNTER — Ambulatory Visit: Payer: 59 | Admitting: Obstetrics & Gynecology

## 2022-05-21 ENCOUNTER — Ambulatory Visit: Payer: 59 | Admitting: Obstetrics & Gynecology

## 2022-05-22 ENCOUNTER — Ambulatory Visit (INDEPENDENT_AMBULATORY_CARE_PROVIDER_SITE_OTHER): Payer: 59 | Admitting: Obstetrics & Gynecology

## 2022-05-22 ENCOUNTER — Encounter: Payer: Self-pay | Admitting: Obstetrics & Gynecology

## 2022-05-22 VITALS — BP 116/78

## 2022-05-22 DIAGNOSIS — N9089 Other specified noninflammatory disorders of vulva and perineum: Secondary | ICD-10-CM

## 2022-05-22 DIAGNOSIS — Z8739 Personal history of other diseases of the musculoskeletal system and connective tissue: Secondary | ICD-10-CM

## 2022-05-22 DIAGNOSIS — M8589 Other specified disorders of bone density and structure, multiple sites: Secondary | ICD-10-CM | POA: Diagnosis not present

## 2022-05-22 NOTE — Progress Notes (Signed)
    [name] [birth year] [id number]        61 y.o.  [id number]   RP: Counseling and management of Osteopenia on recent BD with H/O Osteoporosis  HPI: H/O Osteoporosis treated with Reclast IV many years ago.  Walking regularly.  Not currently taking Vit D supplement, will restart.  Ca++ in nutrition.     OB History  Gravida Para Term Preterm AB Living  3 2 2   1 2   SAB IAB Ectopic Multiple Live Births  1            # Outcome Date GA Lbr Len/2nd Weight Sex Delivery Anes PTL Lv  3 Term           2 Term           1 SAB             Past medical history,surgical history, problem list, medications, allergies, family history and social history were all reviewed and documented in the EPIC chart.   Directed ROS with pertinent positives and negatives documented in the history of present illness/assessment and plan.  Exam:  Vitals:   05/22/22 1112  BP: 116/78   General appearance:  Normal  Bone Density 05/15/2022:  Osteopenia T-Score -2.4 at the Left Femoral Neck.  Osteopenia at all sites.  Not comparable with last BD in 2020.   Assessment/Plan:  61 y.o. [id number]   1. H/O osteoporosis Bone Density 05/15/22 results thoroughly reviewed.  Counseling and management of Osteopenia with T-Score at -2.4 at the Left Femoral Neck with osteopenia at all sites in the context of  previous treatment of Osteoporosis on Reclast many years ago.  Counseling on Bone Medications.  Did not tolerate Bisphosphonate PO previously.  Decision to start on Prolia.  No CI.  Benefits and risks of Prolia reviewed.  Usage discussed.  Will check Vit D and Ca++ levels today. - Vitamin D (25 hydroxy) - Calcium  2. Vulvar lesion  Vulvar Bx:  No dysplasia or Cancer.  Patient reassured.  Counseling and management, review of documentation, on BD, Osteopenia, h/o Osteoporosis, Bone medication, healthy Bone habits and vulvar Bx results for 25 minutes.  Princess Bruins MD, 11:39 AM 05/22/2022

## 2022-05-23 LAB — CALCIUM: Calcium: 9.8 mg/dL (ref 8.6–10.4)

## 2022-05-23 LAB — VITAMIN D 25 HYDROXY (VIT D DEFICIENCY, FRACTURES): Vit D, 25-Hydroxy: 38 ng/mL (ref 30–100)

## 2022-05-30 ENCOUNTER — Telehealth: Payer: Self-pay | Admitting: *Deleted

## 2022-05-30 DIAGNOSIS — M810 Age-related osteoporosis without current pathological fracture: Secondary | ICD-10-CM

## 2022-05-30 NOTE — Telephone Encounter (Signed)
Prolia insurance verification has been sent awaiting Summary of benefits  

## 2022-07-15 NOTE — Telephone Encounter (Signed)
Prolia is not a covered me under patients insurance. Patient will have to pay out of pocket. Patient would like to request an alternative medication for treatment of osteoporosis  Routing to Provider

## 2022-09-04 NOTE — Telephone Encounter (Addendum)
Reclast instructions   Deductible  1500 ($1500 met)  Reference # [id number]  Annual Exam (1 year) ?   Called pt to verify insurance coverage / inform pt Reclast is in Process ?  Labs must be in 30 days window Serum Creatinine DATE? Appt 11/15/2022 Serum Calcium DATE? Appt 11/15/2022   PA needed? No  Cost for Pt? 10% co insurance $  Order form filled out and faxed  w/MD sig?  Infusion will be done at ?  Pt aware?  Instructions mailed to pt?

## 2022-11-15 ENCOUNTER — Other Ambulatory Visit: Payer: 59

## 2022-11-18 ENCOUNTER — Other Ambulatory Visit: Payer: 59

## 2022-11-18 DIAGNOSIS — M810 Age-related osteoporosis without current pathological fracture: Secondary | ICD-10-CM

## 2022-11-19 ENCOUNTER — Encounter: Payer: Self-pay | Admitting: Obstetrics & Gynecology

## 2022-11-19 LAB — CALCIUM: Calcium: 9.1 mg/dL (ref 8.6–10.4)

## 2022-11-19 LAB — CREATININE, SERUM: Creat: 1.11 mg/dL — ABNORMAL HIGH (ref 0.50–1.05)

## 2022-11-19 NOTE — Telephone Encounter (Signed)
Creatine level 1.11 routing to provider to see how to proceed regarding Reclast

## 2022-11-20 NOTE — Telephone Encounter (Signed)
Patient is in process for reclast approval.  It appears [employer] sent you a patient call due to elevated level. Please advise

## 2022-11-25 ENCOUNTER — Encounter: Payer: Self-pay | Admitting: Obstetrics & Gynecology

## 2022-11-25 NOTE — Telephone Encounter (Signed)
Patient aware of apt. Reclast instructions emailed to patient

## 2022-11-26 NOTE — Telephone Encounter (Signed)
Spoke with patient, this is regarding her daughter. Open telephone encounter addressing concerns.   Encounter closed.

## 2022-11-27 ENCOUNTER — Other Ambulatory Visit (HOSPITAL_COMMUNITY): Payer: Self-pay

## 2022-11-28 ENCOUNTER — Ambulatory Visit (HOSPITAL_COMMUNITY)
Admission: RE | Admit: 2022-11-28 | Discharge: 2022-11-28 | Disposition: A | Discharge status: Home / Self Care | Payer: 59 | Source: Ambulatory Visit | Attending: Obstetrics & Gynecology | Admitting: Obstetrics & Gynecology

## 2022-11-28 DIAGNOSIS — M810 Age-related osteoporosis without current pathological fracture: Secondary | ICD-10-CM | POA: Insufficient documentation

## 2022-11-28 MED ORDER — ZOLEDRONIC ACID 5 MG/100ML IV SOLN
5.0000 mg | Freq: Once | INTRAVENOUS | Status: AC
  Administered 2022-11-28: 5 mg via INTRAVENOUS

## 2022-11-28 MED ORDER — ZOLEDRONIC ACID 5 MG/100ML IV SOLN
INTRAVENOUS | Status: AC
  Filled 2022-11-28: qty 100

## 2023-01-28 ENCOUNTER — Other Ambulatory Visit: Payer: Self-pay | Admitting: Obstetrics & Gynecology

## 2023-01-28 DIAGNOSIS — Z1231 Encounter for screening mammogram for malignant neoplasm of breast: Secondary | ICD-10-CM

## 2023-03-10 ENCOUNTER — Ambulatory Visit
Admission: RE | Admit: 2023-03-10 | Discharge: 2023-03-10 | Disposition: A | Discharge status: Home / Self Care | Payer: Managed Care, Other (non HMO) | Source: Ambulatory Visit | Attending: Obstetrics & Gynecology | Admitting: Obstetrics & Gynecology

## 2023-03-10 DIAGNOSIS — Z1231 Encounter for screening mammogram for malignant neoplasm of breast: Secondary | ICD-10-CM

## 2023-05-21 ENCOUNTER — Ambulatory Visit (INDEPENDENT_AMBULATORY_CARE_PROVIDER_SITE_OTHER): Payer: Managed Care, Other (non HMO) | Admitting: Obstetrics & Gynecology

## 2023-05-21 ENCOUNTER — Encounter: Payer: Self-pay | Admitting: Obstetrics & Gynecology

## 2023-05-21 VITALS — BP 120/78 | HR 73 | Ht 60.25 in | Wt 147.0 lb

## 2023-05-21 DIAGNOSIS — Z01419 Encounter for gynecological examination (general) (routine) without abnormal findings: Secondary | ICD-10-CM

## 2023-05-21 DIAGNOSIS — Z90710 Acquired absence of both cervix and uterus: Secondary | ICD-10-CM | POA: Diagnosis not present

## 2023-05-21 DIAGNOSIS — Z90722 Acquired absence of ovaries, bilateral: Secondary | ICD-10-CM

## 2023-05-21 DIAGNOSIS — N904 Leukoplakia of vulva: Secondary | ICD-10-CM

## 2023-05-21 DIAGNOSIS — Z780 Asymptomatic menopausal state: Secondary | ICD-10-CM

## 2023-05-21 DIAGNOSIS — Z8739 Personal history of other diseases of the musculoskeletal system and connective tissue: Secondary | ICD-10-CM | POA: Diagnosis not present

## 2023-05-21 DIAGNOSIS — N941 Unspecified dyspareunia: Secondary | ICD-10-CM

## 2023-05-21 NOTE — Progress Notes (Signed)
[name] 04/18/1961 [id number]   History:    62 y.o.  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  Postmenopause, well on no HRT.  Previously took Estrogen HRT x 5-6 yrs.  No PMB.  No pelvic pain. Vulvar Bx Benign, Leukoplakia, in 04/2022. BMs normal.  Sexually active. Anterior superficial pain with IC. Pap Neg in 04/2022, no h/o abnormal Pap.  Breasts normal.  Mammo Neg 03/2023. Bone Density in 05/2022 Osteopenia T-Score -2.4.  Restarted on Reclast.  Hx vitamin D deficiency.  Walking.  Started a fitness program recently. BMI 28.47. COLONOSCOPY: 2019.   Past medical history,surgical history, family history and social history were all reviewed and documented in the EPIC chart.  Gynecologic History Patient's last menstrual period was 12/09/1996.  Obstetric History OB History  Gravida Para Term Preterm AB Living  3 2 2   1 2   SAB IAB Ectopic Multiple Live Births  1            # Outcome Date GA Lbr Len/2nd Weight Sex Delivery Anes PTL Lv  3 Term           2 Term           1 SAB              ROS: A ROS was performed and pertinent positives and negatives are included in the history. GENERAL: No fevers or chills. HEENT: No change in vision, no earache, sore throat or sinus congestion. NECK: No pain or stiffness. CARDIOVASCULAR: No chest pain or pressure. No palpitations. PULMONARY: No shortness of breath, cough or wheeze. GASTROINTESTINAL: No abdominal pain, nausea, vomiting or diarrhea, melena or bright red blood per rectum. GENITOURINARY: No urinary frequency, urgency, hesitancy or dysuria. MUSCULOSKELETAL: No joint or muscle pain, no back pain, no recent trauma. DERMATOLOGIC: No rash, no itching, no lesions. ENDOCRINE: No polyuria, polydipsia, no heat or cold intolerance. No recent change in weight. HEMATOLOGICAL: No anemia or easy bruising or  bleeding. NEUROLOGIC: No headache, seizures, numbness, tingling or weakness. PSYCHIATRIC: No depression, no loss of interest in normal activity or change in sleep pattern.     Exam:   BP 120/78   Pulse 73   Ht 5' 0.25" (1.53 m)   Wt 147 lb (66.7 kg)   LMP 12/09/1996 Comment: sexually active  SpO2 98%   BMI 28.47 kg/m   Body mass index is 28.47 kg/m.  General appearance : Well developed well nourished female. No acute distress HEENT: Eyes: no retinal hemorrhage or exudates,  Neck supple, trachea midline, no carotid bruits, no thyroidmegaly Lungs: Clear to auscultation, no rhonchi or wheezes, or rib retractions  Heart: Regular rate and rhythm, no murmurs or gallops Breast:Examined in sitting and supine position were symmetrical in appearance, no palpable masses or tenderness,  no skin retraction, no nipple inversion, no nipple discharge, no skin discoloration, no axillary or supraclavicular lymphadenopathy Abdomen: no palpable masses or tenderness, no rebound or guarding Extremities: no edema or skin discoloration or tenderness  Pelvic: Vulva: Anterior white atrophy with small fissure             Vagina: No gross lesions or discharge  Cervix/Uterus absent  Adnexa  Without masses or tenderness  Anus: Normal   Assessment/Plan:  62 y.o. female for annual exam   1. Well female exam with routine gynecological exam  S/P Vaginal Hysterectomy for prolapse, then BSO for benign ovarian cysts.  Postmenopause, well on no HRT.  Previously took Estrogen HRT x 5-6 yrs.  No PMB.  No pelvic pain. Vulvar Bx Benign, Leukoplakia, in 04/2022. BMs normal.  Sexually active. Anterior superficial pain with IC. Pap Neg in 04/2022, no h/o abnormal Pap.  Breasts normal.  Mammo Neg 03/2023. Bone Density in 05/2022 Osteopenia T-Score -2.4.  Restarted on Reclast.  Hx vitamin D deficiency.  Walking. BMI 28.47. COLONOSCOPY: 2019.  2. S/P TAH-BSO  3. Postmenopause S/P Vaginal Hysterectomy for prolapse, then BSO for  benign ovarian cysts.  Postmenopause, well on no HRT.    4. H/O osteoporosis Bone Density in 05/2022 Osteopenia T-Score -2.4.  Restarted on Reclast.  Hx vitamin D deficiency, taking a supplement.  Walking.  Continue on Reclast.  5. Dyspareunia in female Probably d/t a combination of Lichen sclerosus and Genitourinary atrophy of menopause.  Will use Replens for IC and restart the Clobetasol ointment at the anterior vulva.  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  Other orders - albuterol (VENTOLIN HFA) 108 (90 Base) MCG/ACT inhaler; Inhale 2 puffs into the lungs every 4 (four) hours as needed. - VITAMIN D PO; Take by mouth every other day. - Ferrous Sulfate (IRON PO); Take 65 mg by mouth in the morning and at bedtime.   Genia Del MD, 9:39 AM

## 2023-09-24 ENCOUNTER — Other Ambulatory Visit: Payer: Self-pay | Admitting: Physician Assistant

## 2023-09-24 DIAGNOSIS — R09A2 Foreign body sensation, throat: Secondary | ICD-10-CM

## 2023-09-29 ENCOUNTER — Other Ambulatory Visit: Payer: Self-pay

## 2023-09-29 DIAGNOSIS — M810 Age-related osteoporosis without current pathological fracture: Secondary | ICD-10-CM | POA: Insufficient documentation

## 2023-09-30 ENCOUNTER — Telehealth: Payer: Self-pay

## 2023-09-30 NOTE — Telephone Encounter (Signed)
Auth Submission: NO AUTH NEEDED Site of care: Site of care: [facility] Payer: [employer] managed Medication & CPT/J Code(s) submitted: Reclast (Zolendronic acid) [id number] Route of submission (phone, fax, portal): phone Phone # Fax # Auth type: Buy/Bill PB Units/visits requested: reclast Reference number: [id number] Approval from: 09/30/23 to 12/09/23

## 2023-10-09 ENCOUNTER — Encounter: Payer: Self-pay | Admitting: Pulmonary Disease

## 2023-10-09 ENCOUNTER — Ambulatory Visit
Admission: RE | Admit: 2023-10-09 | Discharge: 2023-10-09 | Disposition: A | Discharge status: Home / Self Care | Payer: Managed Care, Other (non HMO) | Source: Ambulatory Visit | Attending: Physician Assistant | Admitting: Physician Assistant

## 2023-10-09 DIAGNOSIS — R09A2 Foreign body sensation, throat: Secondary | ICD-10-CM

## 2023-11-13 ENCOUNTER — Ambulatory Visit: Payer: Managed Care, Other (non HMO) | Admitting: Orthopedic Surgery

## 2023-11-20 ENCOUNTER — Ambulatory Visit: Payer: Managed Care, Other (non HMO) | Admitting: Family Medicine

## 2023-11-20 ENCOUNTER — Telehealth: Payer: Self-pay | Admitting: *Deleted

## 2023-11-20 NOTE — Telephone Encounter (Signed)
Call to patient. Advised patient was calling to set up Reclast infusion, but saw she was already scheduled 12-01-23. Patient states she saw her Primary back in October and they helped her set up her appointment because she wanted to get it done prior to the end of the year since she has reached her deductible.   Routing to provider as Lorain Childes. Encounter closed.

## 2023-12-01 ENCOUNTER — Ambulatory Visit: Payer: Managed Care, Other (non HMO)

## 2023-12-01 MED ORDER — DIPHENHYDRAMINE HCL 25 MG PO CAPS
25.0000 mg | ORAL_CAPSULE | Freq: Once | ORAL | Status: DC
Start: 2023-12-01 — End: 2023-12-01

## 2023-12-01 MED ORDER — ACETAMINOPHEN 325 MG PO TABS
650.0000 mg | ORAL_TABLET | Freq: Once | ORAL | Status: DC
Start: 2023-12-01 — End: 2023-12-01

## 2023-12-01 MED ORDER — SODIUM CHLORIDE 0.9 % IV SOLN
INTRAVENOUS | Status: DC
Start: 2023-12-01 — End: 2023-12-01

## 2023-12-01 MED ORDER — ZOLEDRONIC ACID 5 MG/100ML IV SOLN
5.0000 mg | Freq: Once | INTRAVENOUS | Status: DC
Start: 2023-12-01 — End: 2023-12-01

## 2023-12-05 ENCOUNTER — Encounter: Payer: Self-pay | Admitting: Pulmonary Disease

## 2023-12-09 ENCOUNTER — Ambulatory Visit: Payer: Managed Care, Other (non HMO)

## 2023-12-09 VITALS — BP 132/85 | HR 72 | Temp 97.8°F | Resp 16 | Ht 60.5 in | Wt 150.6 lb

## 2023-12-09 DIAGNOSIS — M810 Age-related osteoporosis without current pathological fracture: Secondary | ICD-10-CM

## 2023-12-09 MED ORDER — ZOLEDRONIC ACID 5 MG/100ML IV SOLN
5.0000 mg | Freq: Once | INTRAVENOUS | Status: AC
  Administered 2023-12-09: 5 mg via INTRAVENOUS
  Filled 2023-12-09: qty 100

## 2023-12-09 MED ORDER — DIPHENHYDRAMINE HCL 25 MG PO CAPS
25.0000 mg | ORAL_CAPSULE | Freq: Once | ORAL | Status: AC
  Administered 2023-12-09: 25 mg via ORAL
  Filled 2023-12-09: qty 1

## 2023-12-09 MED ORDER — ACETAMINOPHEN 325 MG PO TABS
650.0000 mg | ORAL_TABLET | Freq: Once | ORAL | Status: AC
  Administered 2023-12-09: 650 mg via ORAL
  Filled 2023-12-09: qty 2

## 2023-12-09 NOTE — Addendum Note (Signed)
Addended by: Nat Math on: 12/09/2023 10:23 AM   Modules accepted: Orders

## 2023-12-09 NOTE — Progress Notes (Signed)
 Diagnosis: Osteoporosis  Provider:  Mannam, Praveen MD  Procedure: IV Infusion  IV Type: Peripheral, IV Location: R Antecubital  Reclast  (Zolendronic Acid), Dose: 5 mg  Infusion Start Time: 0852 am  Infusion Stop Time: 0922 am  Post Infusion IV Care: Observation period completed and Peripheral IV Discontinued  Discharge: Condition: Good, Destination: Home . AVS Declined  Performed by:  Trudy Lamarr LABOR, RN

## 2023-12-11 ENCOUNTER — Ambulatory Visit: Payer: Managed Care, Other (non HMO) | Admitting: Family Medicine

## 2024-02-13 ENCOUNTER — Other Ambulatory Visit: Payer: Self-pay | Admitting: Family Medicine

## 2024-02-13 DIAGNOSIS — Z1231 Encounter for screening mammogram for malignant neoplasm of breast: Secondary | ICD-10-CM

## 2024-03-10 ENCOUNTER — Ambulatory Visit
Admission: RE | Admit: 2024-03-10 | Discharge: 2024-03-10 | Disposition: A | Discharge status: Home / Self Care | Source: Ambulatory Visit | Attending: Family Medicine | Admitting: Family Medicine

## 2024-03-10 DIAGNOSIS — Z1231 Encounter for screening mammogram for malignant neoplasm of breast: Secondary | ICD-10-CM

## 2024-05-24 ENCOUNTER — Ambulatory Visit (INDEPENDENT_AMBULATORY_CARE_PROVIDER_SITE_OTHER): Payer: Managed Care, Other (non HMO) | Admitting: Obstetrics and Gynecology

## 2024-05-24 ENCOUNTER — Encounter: Payer: Self-pay | Admitting: Obstetrics and Gynecology

## 2024-05-24 VITALS — BP 114/74 | HR 68 | Ht 62.0 in | Wt 140.0 lb

## 2024-05-24 DIAGNOSIS — Z01419 Encounter for gynecological examination (general) (routine) without abnormal findings: Secondary | ICD-10-CM

## 2024-05-24 DIAGNOSIS — M8589 Other specified disorders of bone density and structure, multiple sites: Secondary | ICD-10-CM | POA: Diagnosis not present

## 2024-05-24 DIAGNOSIS — Z1331 Encounter for screening for depression: Secondary | ICD-10-CM | POA: Diagnosis not present

## 2024-05-24 DIAGNOSIS — Z5181 Encounter for therapeutic drug level monitoring: Secondary | ICD-10-CM | POA: Diagnosis not present

## 2024-05-24 DIAGNOSIS — N952 Postmenopausal atrophic vaginitis: Secondary | ICD-10-CM

## 2024-05-24 MED ORDER — CLOBETASOL PROPIONATE 0.05 % EX OINT: TOPICAL_OINTMENT | CUTANEOUS | 0 refills | Status: DC

## 2024-05-24 NOTE — Patient Instructions (Signed)

## 2024-05-24 NOTE — Progress Notes (Signed)
 63 y.o. G80P2014 Married Caucasian female here for annual exam.  Has painful intercourse would like to discuss her options.  Hx lichen sclerosis on chart review. Used vaginal estrogen cream in the past.   Has tried Astroglide and Replens.  Biopsy of anterior left biopsy below clitoris, squamous call hyperplasia, consistent with leukoplakia.  No dysplasia and no cancer.   Notes some irritation to the area.   Her last Reclast  was done in December and ordered through her PCP, due to Dr. Carlean Charter departure from this office.  She wants this office to manage her bone health.  4 children, 2 adopted from [place].   Dog died.  Stresses at home and at work.   PCP: Errol Heaps, MD   Patient's last menstrual period was 12/09/1996.           Sexually active: Yes.    The current method of family planning is status post hysterectomy.    Menopausal hormone therapy:  n/a Exercising: Yes.    Walking Smoker:  no  OB History  Gravida Para Term Preterm AB Living  3 2 2  1 4   SAB IAB Ectopic Multiple Live Births  1        # Outcome Date GA Lbr Len/2nd Weight Sex Type Anes PTL Lv  3 Term           2 Term           1 SAB              HEALTH MAINTENANCE: Last 2 paps:  04/23/22 neg, 07/16/18 neg  History of abnormal Pap or positive HPV:  no Mammogram:   03/10/24 Breast Density Cat A, BIRADS Cat 1 neg  Colonoscopy:  2019 Bone Density:  05/15/22  Result  osteopenia bilateral hips and spine. On Reclast  for 3 doses today, not consecutive.    Immunization History  Administered Date(s) Administered   Hepatitis B, ADULT 09/29/2001   Influenza Split 09/13/2011, 09/24/2013   Influenza,inj,Quad PF,6+ Mos 10/02/2016, 09/19/2017, 09/10/2018, 09/11/2019, 08/31/2020   Influenza-Unspecified 08/09/2016   PFIZER(Purple Top)SARS-COV-2 Vaccination 02/18/2020, 03/10/2020   Pneumococcal Conjugate-13 10/08/2016   Pneumococcal Polysaccharide-23 08/25/2020   Pneumococcal-Unspecified 08/09/2016   Td 10/09/2004    Tdap 10/23/2004, 03/07/2014   Zoster Recombinant(Shingrix) 09/18/2019   Zoster, Live 07/24/2020, 11/30/2020      reports that she has never smoked. She has never used smokeless tobacco. She reports that she does not currently use alcohol. She reports that she does not use drugs.  Past Medical History:  Diagnosis Date   Allergic rhinitis    Anxiety    Arthritis    Asthma    Broken collarbone 2022   GERD (gastroesophageal reflux disease)    IBS (irritable bowel syndrome)    Insomnia    Iron deficiency anemia    Osteoarthritis    Osteoporosis 10/2019   T score -2.6 DEXA 2011 with T score -2.6, follow-up DEXA 2020 T score -2.1   Plantar fasciitis    Radial head fracture    Restless leg     Past Surgical History:  Procedure Laterality Date   APPENDECTOMY     ARTERY BIOPSY Left 10/08/2018   Procedure: BIOPSY LEFT  TEMPORAL ARTERY;  Surgeon: Oza Blumenthal, MD;  Location: [facility];  Service: General;  Laterality: Left;   ERAS PATHWAY   bladder distention  2009   BREAST EXCISIONAL BIOPSY Bilateral    CALCANEAL OSTEOTOMY Right 01/01/2018   Procedure: Right Lateral Calcaneal Osteotomy;  Surgeon: Amada Backer, MD;  Location: [facility];  Service: Orthopedics;  Laterality: Right;   HAND SURGERY Left 11/16/2020   KNEE SURGERY  03/2004   KNEE SURGERY  2016   METATARSAL OSTEOTOMY Right 01/01/2018   Procedure: Dorsiflexion Osteotomy of the First Metatarsal; Plantar Fascia Release;  Surgeon: Amada Backer, MD;  Location: [facility];  Service: Orthopedics;  Laterality: Right;   OOPHORECTOMY     laparoscopic BSO for benign cysts   REFRACTIVE SURGERY  2016   REPAIR OF RECTOCELE     TENDON TRANSFER Right 01/01/2018   Procedure: Right Peroneus Longus Tendon Debridement and Tenolysis; Transfer of Peroneus Brevis to Peroneus Longus Tendon;  Surgeon: Amada Backer, MD;  Location: [facility];  Service: Orthopedics;  Laterality:  Right;   VAGINAL HYSTERECTOMY     early 2000's for uterine prolapse and rectocele    Current Outpatient Medications  Medication Sig Dispense Refill   albuterol (VENTOLIN HFA) 108 (90 Base) MCG/ACT inhaler Inhale 2 puffs into the lungs every 4 (four) hours as needed.     ALPRAZolam (XANAX) 0.25 MG tablet 1 tablet     BREO ELLIPTA 200-25 MCG/INH AEPB Inhale 1 puff into the lungs as needed.     buPROPion (WELLBUTRIN XL) 150 MG 24 hr tablet Take 150 mg by mouth every morning.     cetirizine (ZYRTEC) 10 MG tablet Take 10 mg by mouth as needed.     Ferrous Sulfate (IRON PO) Take 65 mg by mouth in the morning and at bedtime.     pramipexole (MIRAPEX) 0.5 MG tablet Take 0.25 mg by mouth at bedtime.     RABEprazole (ACIPHEX) 20 MG tablet Take 20 mg by mouth every other day.     tretinoin (RETIN-A) 0.05 % cream Apply topically.     VITAMIN D  PO Take by mouth every other day.     venlafaxine XR (EFFEXOR-XR) 75 MG 24 hr capsule Take 75 mg by mouth daily with breakfast. (Patient not taking: Reported on 05/24/2024)     No current facility-administered medications for this visit.    ALLERGIES: Buspirone, Celexa [citalopram], Cephalexin, Clonazepam, Decongestant [pseudoephedrine hcl er], Erythromycin, Insulins, Lorazepam, Mupirocin, Neomycin, Other, Petrol dist-piperonyl butoxide-pyrethrins [pyrethrins-piperonyl butoxide], Pramipexole dihydrochloride, Promethazine hcl, Restasis [cyclosporine], Zoloft [sertraline], and Bacitracin-neomycin-polymyxin  Family History  Problem Relation Age of Onset   Hypertension Mother    Heart failure Mother    Diabetes Father    Hypertension Father    Heart disease Father    Cancer Father        Prostate   Hypertension Sister    Cancer Sister        Melanoma   Heart disease Paternal Uncle    Diabetes Maternal Grandmother    Heart disease Maternal Grandmother    Diabetes Maternal Grandfather    Heart disease Maternal Grandfather    Diabetes Paternal Grandmother     Heart disease Paternal Grandmother    Diabetes Paternal Grandfather    Heart disease Paternal Grandfather     Review of Systems  All other systems reviewed and are negative.   PHYSICAL EXAM:  BP 114/74 (BP Location: Left Arm, Patient Position: Sitting)   Pulse 68   Ht 5' 2 (1.575 m)   Wt 140 lb (63.5 kg)   LMP 12/09/1996 Comment: sexually active  SpO2 96%   BMI 25.61 kg/m     General appearance: alert, cooperative and appears stated age Head: normocephalic, without obvious abnormality, atraumatic Neck: no  adenopathy, supple, symmetrical, trachea midline and thyroid normal to inspection and palpation Lungs: clear to auscultation bilaterally Breasts: normal appearance, no masses or tenderness, No nipple retraction or dimpling, No nipple discharge or bleeding, No axillary adenopathy Heart: regular rate and rhythm Abdomen: soft, non-tender; no masses, no organomegaly Extremities: extremities normal, atraumatic, no cyanosis or edema Skin: skin color, texture, turgor normal. No rashes or lesions Lymph nodes: cervical, supraclavicular, and axillary nodes normal. Neurologic: grossly normal  Pelvic: External genitalia:  very minor white skin coloration change below clitoris and on the perineum.              No abnormal inguinal nodes palpated.              Urethra:  normal appearing urethra with no masses, tenderness or lesions              Bartholins and Skenes: normal                 Vagina: normal appearing vagina with normal color and discharge, no lesions              Cervix: absent              Pap taken: no Bimanual Exam:  Uterus:  absent              Adnexa: no mass, fullness, tenderness              Rectal exam: yes.  Confirms.              Anus:  normal sphincter tone, no lesions  Chaperone was present for exam:  Cottie Diss, CMA  ASSESSMENT: Well woman visit with gynecologic exam. Status post TVH for prolapse and rectocele repair. Status post BSO for benign ovarian  cysts.  Osteopenia.  On Reclast .  Status post 3 injections.  Vaginal atrophy.  Hx vulvitis.  PHQ-9: 0 Situational stress.   PLAN: Mammogram screening discussed. Self breast awareness reviewed. Pap and HRV collected:  no.  Not indicated. Guidelines for Calcium, Vitamin D , regular exercise program including cardiovascular and weight bearing exercise. Medication refills:  Clobetasol  ointment. Instructed in use.  [facility] for [facility].  Will await results before making any decisions about additional tx.  Patient agrees with this plan.  Tx options for atrophy:  lubricants, cooking oils, vag vit E, estrogens, Intrarosa.  Patient will focus on OTC txs. Brochure for [facility] counseling.  Labs with PCP.  Follow up:  yearly and prn.

## 2024-10-22 ENCOUNTER — Other Ambulatory Visit (HOSPITAL_BASED_OUTPATIENT_CLINIC_OR_DEPARTMENT_OTHER): Payer: Self-pay | Admitting: Family Medicine

## 2024-10-22 DIAGNOSIS — M810 Age-related osteoporosis without current pathological fracture: Secondary | ICD-10-CM

## 2024-11-25 ENCOUNTER — Other Ambulatory Visit (HOSPITAL_BASED_OUTPATIENT_CLINIC_OR_DEPARTMENT_OTHER): Payer: Self-pay | Admitting: Family Medicine

## 2024-11-25 DIAGNOSIS — E7800 Pure hypercholesterolemia, unspecified: Secondary | ICD-10-CM

## 2024-11-25 DIAGNOSIS — M810 Age-related osteoporosis without current pathological fracture: Secondary | ICD-10-CM

## 2024-11-29 ENCOUNTER — Ambulatory Visit (HOSPITAL_BASED_OUTPATIENT_CLINIC_OR_DEPARTMENT_OTHER)
Admission: RE | Admit: 2024-11-29 | Discharge: 2024-11-29 | Disposition: A | Discharge status: Home / Self Care | Source: Ambulatory Visit | Attending: Family Medicine | Admitting: Family Medicine

## 2024-11-29 ENCOUNTER — Ambulatory Visit (HOSPITAL_BASED_OUTPATIENT_CLINIC_OR_DEPARTMENT_OTHER)
Admission: RE | Admit: 2024-11-29 | Discharge: 2024-11-29 | Disposition: A | Discharge status: Home / Self Care | Payer: Self-pay | Source: Ambulatory Visit | Attending: Family Medicine | Admitting: Family Medicine

## 2024-11-29 DIAGNOSIS — E7800 Pure hypercholesterolemia, unspecified: Secondary | ICD-10-CM

## 2024-11-29 DIAGNOSIS — M810 Age-related osteoporosis without current pathological fracture: Secondary | ICD-10-CM | POA: Diagnosis present

## 2024-12-01 ENCOUNTER — Other Ambulatory Visit (HOSPITAL_BASED_OUTPATIENT_CLINIC_OR_DEPARTMENT_OTHER): Payer: Self-pay

## 2024-12-01 MED ORDER — HYDROCODONE-ACETAMINOPHEN 5-325 MG PO TABS
1.0000 | ORAL_TABLET | ORAL | 0 refills | Status: DC | PRN
  Filled 2024-12-01: qty 42, 7d supply, fill #0

## 2024-12-01 MED ORDER — METHOCARBAMOL 500 MG PO TABS
500.0000 mg | ORAL_TABLET | Freq: Four times a day (QID) | ORAL | 0 refills | Status: DC | PRN
  Filled 2024-12-01: qty 40, 10d supply, fill #0

## 2024-12-03 ENCOUNTER — Other Ambulatory Visit (HOSPITAL_BASED_OUTPATIENT_CLINIC_OR_DEPARTMENT_OTHER)

## 2024-12-13 ENCOUNTER — Other Ambulatory Visit (HOSPITAL_BASED_OUTPATIENT_CLINIC_OR_DEPARTMENT_OTHER): Payer: Self-pay

## 2024-12-13 MED ORDER — HYDROCODONE-ACETAMINOPHEN 5-325 MG PO TABS
1.0000 | ORAL_TABLET | ORAL | 0 refills | Status: AC | PRN
  Filled 2024-12-13: qty 42, 7d supply, fill #0

## 2024-12-15 ENCOUNTER — Other Ambulatory Visit (HOSPITAL_BASED_OUTPATIENT_CLINIC_OR_DEPARTMENT_OTHER): Payer: Self-pay

## 2024-12-15 MED ORDER — METHOCARBAMOL 500 MG PO TABS
500.0000 mg | ORAL_TABLET | Freq: Four times a day (QID) | ORAL | 0 refills | Status: AC | PRN
  Filled 2024-12-15: qty 40, 10d supply, fill #0

## 2024-12-15 MED ORDER — CELECOXIB 200 MG PO CAPS
200.0000 mg | ORAL_CAPSULE | Freq: Every day | ORAL | 0 refills | Status: DC
  Filled 2024-12-15: qty 30, 30d supply, fill #0

## 2024-12-15 MED ORDER — SENNOSIDES 8.6 MG PO TABS
2.0000 | ORAL_TABLET | Freq: Every day | ORAL | 0 refills | Status: AC
  Filled 2024-12-15: qty 28, 14d supply, fill #0

## 2024-12-17 ENCOUNTER — Observation Stay (HOSPITAL_COMMUNITY)
Admission: EM | Admit: 2024-12-17 | Discharge: 2024-12-20 | Disposition: A | Discharge status: Home / Self Care | Attending: Family Medicine | Admitting: Family Medicine

## 2024-12-17 ENCOUNTER — Other Ambulatory Visit: Payer: Self-pay

## 2024-12-17 ENCOUNTER — Encounter (HOSPITAL_COMMUNITY): Payer: Self-pay | Admitting: *Deleted

## 2024-12-17 DIAGNOSIS — K317 Polyp of stomach and duodenum: Secondary | ICD-10-CM | POA: Insufficient documentation

## 2024-12-17 DIAGNOSIS — K5733 Diverticulitis of large intestine without perforation or abscess with bleeding: Secondary | ICD-10-CM | POA: Diagnosis not present

## 2024-12-17 DIAGNOSIS — K2951 Unspecified chronic gastritis with bleeding: Principal | ICD-10-CM | POA: Insufficient documentation

## 2024-12-17 DIAGNOSIS — K229 Disease of esophagus, unspecified: Secondary | ICD-10-CM | POA: Insufficient documentation

## 2024-12-17 DIAGNOSIS — K5792 Diverticulitis of intestine, part unspecified, without perforation or abscess without bleeding: Secondary | ICD-10-CM

## 2024-12-17 DIAGNOSIS — G2581 Restless legs syndrome: Secondary | ICD-10-CM | POA: Insufficient documentation

## 2024-12-17 DIAGNOSIS — J45909 Unspecified asthma, uncomplicated: Secondary | ICD-10-CM | POA: Diagnosis not present

## 2024-12-17 DIAGNOSIS — K449 Diaphragmatic hernia without obstruction or gangrene: Secondary | ICD-10-CM | POA: Insufficient documentation

## 2024-12-17 DIAGNOSIS — K921 Melena: Secondary | ICD-10-CM | POA: Diagnosis present

## 2024-12-17 DIAGNOSIS — K219 Gastro-esophageal reflux disease without esophagitis: Secondary | ICD-10-CM | POA: Diagnosis not present

## 2024-12-17 LAB — URINALYSIS, ROUTINE W REFLEX MICROSCOPIC
Bilirubin Urine: NEGATIVE
Glucose, UA: NEGATIVE mg/dL
Ketones, ur: NEGATIVE mg/dL
Leukocytes,Ua: NEGATIVE
Nitrite: NEGATIVE
Protein, ur: NEGATIVE mg/dL
Specific Gravity, Urine: 1.025 (ref 1.005–1.030)
pH: 6 (ref 5.0–8.0)

## 2024-12-17 LAB — CBC WITH DIFFERENTIAL/PLATELET
Abs Immature Granulocytes: 0.03 K/uL (ref 0.00–0.07)
Basophils Absolute: 0.1 K/uL (ref 0.0–0.1)
Basophils Relative: 1 %
Eosinophils Absolute: 0.4 K/uL (ref 0.0–0.5)
Eosinophils Relative: 4 %
HCT: 35.9 % — ABNORMAL LOW (ref 36.0–46.0)
Hemoglobin: 11 g/dL — ABNORMAL LOW (ref 12.0–15.0)
Immature Granulocytes: 0 %
Lymphocytes Relative: 16 %
Lymphs Abs: 1.6 K/uL (ref 0.7–4.0)
MCH: 28.3 pg (ref 26.0–34.0)
MCHC: 30.6 g/dL (ref 30.0–36.0)
MCV: 92.3 fL (ref 80.0–100.0)
Monocytes Absolute: 0.6 K/uL (ref 0.1–1.0)
Monocytes Relative: 7 %
Neutro Abs: 6.8 K/uL (ref 1.7–7.7)
Neutrophils Relative %: 72 %
Platelets: 438 K/uL — ABNORMAL HIGH (ref 150–400)
RBC: 3.89 MIL/uL (ref 3.87–5.11)
RDW: 13.3 % (ref 11.5–15.5)
WBC: 9.5 K/uL (ref 4.0–10.5)
nRBC: 0 % (ref 0.0–0.2)

## 2024-12-17 LAB — COMPREHENSIVE METABOLIC PANEL WITH GFR
ALT: 16 U/L (ref 0–44)
AST: 33 U/L (ref 15–41)
Albumin: 4 g/dL (ref 3.5–5.0)
Alkaline Phosphatase: 86 U/L (ref 38–126)
Anion gap: 10 (ref 5–15)
BUN: 16 mg/dL (ref 8–23)
CO2: 24 mmol/L (ref 22–32)
Calcium: 9.2 mg/dL (ref 8.9–10.3)
Chloride: 105 mmol/L (ref 98–111)
Creatinine, Ser: 0.86 mg/dL (ref 0.44–1.00)
GFR, Estimated: >60 mL/min
Glucose, Bld: 97 mg/dL (ref 70–99)
Potassium: 5 mmol/L (ref 3.5–5.1)
Sodium: 138 mmol/L (ref 135–145)
Total Bilirubin: 0.3 mg/dL (ref 0.0–1.2)
Total Protein: 6.7 g/dL (ref 6.5–8.1)

## 2024-12-17 LAB — TYPE AND SCREEN
ABO/RH(D): O POS
Antibody Screen: NEGATIVE

## 2024-12-17 LAB — URINALYSIS, MICROSCOPIC (REFLEX)

## 2024-12-17 LAB — ABO/RH: ABO/RH(D): O POS

## 2024-12-17 LAB — LIPASE, BLOOD: Lipase: 14 U/L (ref 11–51)

## 2024-12-17 NOTE — ED Provider Triage Note (Signed)
 Emergency Medicine Provider Triage Evaluation Note  [name] , a 64 y.o. female  was evaluated in triage.  Pt complains of bleeding since this morning, dark red blood.  Review of Systems  Positive: Fatigue, dark red blood per rectum, abdominal pain Negative: Vomiting, fever, chills  Physical Exam  BP (!) 156/92   Pulse (!) 111   Temp 97.8 F (36.6 C)   Resp 17   Ht 5' 2 (1.575 m)   Wt 63.5 kg   LMP 12/09/1996 Comment: sexually active  SpO2 99%   BMI 25.60 kg/m  Gen:   Awake, no distress, mildly uncomfortable appearing Resp:  Normal effort  MSK:   Moves extremities without difficulty Other:  Tachycardic  Medical Decision Making  Medically screening exam initiated at 3:12 PM.  Appropriate orders placed.  [name] was informed that the remainder of the evaluation will be completed by another provider, this initial triage assessment does not replace that evaluation, and the importance of remaining in the ED until their evaluation is complete.  Labs ordered   [name] Ileana SAILOR, PA-C 12/17/24 1513

## 2024-12-17 NOTE — ED Triage Notes (Signed)
 POV/ ambulatory/ noticed blood in stool this am/ went to UC, pos blood on rectal exam/ sent for scans, pt is pale and c/o lightheadedness/ A&OX4

## 2024-12-17 NOTE — ED Provider Notes (Signed)
 " [facility]PT [facility] Emergency Department Provider Note MRN:  [id number]  Arrival date & time: 12/17/2024     Chief Complaint   Blood In Stools   History of Present Illness   [name] is a 64 y.o. year-old female presents to the ED with chief complaint of bloody stools.  Has had several bloody stools today.  States that she has had a total of 4 blood stools today.  States that she has felt fatigued.  States that she has been a bit pale.  Denies SOB.  States that she has been getting very tired and weak when she walks.  Denies family history of colon cancer. States that she hasn't had a colonoscopy in a several years.  The last I can see in the chart was from 2013.  She states she has never had an abnormal colonoscopy.  States that she has had some lower abdominal cramping. Denies fever, or chills.  History provided by patient. {RB interpreter (Optional):27221}  Review of Systems  Pertinent positive and negative review of systems noted in HPI.    Physical Exam   Vitals:   12/17/24 1951 12/17/24 2019  BP: 114/68 111/70  Pulse: 98 96  Resp:  20  Temp: 98.3 F (36.8 C) 97.9 F (36.6 C)  SpO2: 98% 97%    CONSTITUTIONAL:  non toxic-appearing, NAD NEURO:  Alert and oriented x 3, CN 3-12 grossly intact EYES:  eyes equal and reactive ENT/NECK:  Supple, no stridor  CARDIO:  normal rate, regular rhythm, appears well-perfused  PULM:  No respiratory distress, CTAB GI/GU:  non-distended,  MSK/SPINE:  No gross deformities, no edema, moves all extremities  SKIN:  no rash, atraumatic   *Additional and/or pertinent findings included in MDM below  Diagnostic and Interventional Summary    EKG Interpretation Date/Time:    Ventricular Rate:    PR Interval:    QRS Duration:    QT Interval:    QTC Calculation:   R Axis:      Text Interpretation:         Labs Reviewed  CBC WITH DIFFERENTIAL/PLATELET - Abnormal; Notable for the following components:      Result  Value   Hemoglobin 11.0 (*)    HCT 35.9 (*)    Platelets 438 (*)    All other components within normal limits  URINALYSIS, ROUTINE W REFLEX MICROSCOPIC - Abnormal; Notable for the following components:   Hgb urine dipstick MODERATE (*)    All other components within normal limits  URINALYSIS, MICROSCOPIC (REFLEX) - Abnormal; Notable for the following components:   Bacteria, UA RARE (*)    All other components within normal limits  COMPREHENSIVE METABOLIC PANEL WITH GFR  LIPASE, BLOOD  POC OCCULT BLOOD, ED  TYPE AND SCREEN  ABO/RH    No orders to display    Medications - No data to display   Procedures  /  Critical Care Procedures  ED Course and Medical Decision Making  I have reviewed the triage vital signs, the nursing notes, and pertinent available records from the EMR.  Social Determinants Affecting Complexity of Care: Patient has no clinically significant social determinants affecting this chief complaint.. {rbsocialsolutions:27068}  ED Course: Clinical Course as of 12/17/24 2358  Fri Dec 17, 2024  2358 CBC with Differential(!) Notable for mild anemia, hemoglobin is 11.0, will plan to repeat this. [RB]  2358 Comprehensive metabolic panel CMP is reassuring, no significant electrolyte abnormality [RB]  2358 Lipase, blood Normal lipase [RB]  2358 Urinalysis, Routine w reflex microscopic -Urine, Clean Catch(!) Urinalysis without evidence of infection. [RB]    Clinical Course User Index [RB] Vicky Charleston, PA-C    Medical Decision Making Patient here with several episodes of bloody bowel movements today.  She reports fatigue and weakness when she is walking.  She states that this is new.  States that she has had some lower abdominal cramping.    Amount and/or Complexity of Data Reviewed Radiology: ordered.      {rbcpddx (Optional):29772:::1} {rbabdddx (Optional):29773:s::1}  Consultants: {rbconsultants:27072}   Treatment and  Plan: {rbadmissionvdc:27069}  {rbattending:27073}  Final Clinical Impressions(s) / ED Diagnoses  No diagnosis found.  ED Discharge Orders     None         Discharge Instructions Discussed with and Provided to Patient:   Discharge Instructions   None    "

## 2024-12-18 ENCOUNTER — Emergency Department (HOSPITAL_COMMUNITY)

## 2024-12-18 DIAGNOSIS — Z791 Long term (current) use of non-steroidal anti-inflammatories (NSAID): Secondary | ICD-10-CM | POA: Diagnosis not present

## 2024-12-18 DIAGNOSIS — D62 Acute posthemorrhagic anemia: Secondary | ICD-10-CM

## 2024-12-18 DIAGNOSIS — K5792 Diverticulitis of intestine, part unspecified, without perforation or abscess without bleeding: Secondary | ICD-10-CM | POA: Diagnosis not present

## 2024-12-18 DIAGNOSIS — K921 Melena: Secondary | ICD-10-CM | POA: Diagnosis not present

## 2024-12-18 LAB — HEMOGLOBIN AND HEMATOCRIT, BLOOD
HCT: 33.8 % — ABNORMAL LOW (ref 36.0–46.0)
Hemoglobin: 10.6 g/dL — ABNORMAL LOW (ref 12.0–15.0)

## 2024-12-18 LAB — CBC
HCT: 29.6 % — ABNORMAL LOW (ref 36.0–46.0)
HCT: 32.6 % — ABNORMAL LOW (ref 36.0–46.0)
HCT: 30.7 % — ABNORMAL LOW (ref 36.0–46.0)
Hemoglobin: 9.1 g/dL — ABNORMAL LOW (ref 12.0–15.0)
Hemoglobin: 10.2 g/dL — ABNORMAL LOW (ref 12.0–15.0)
Hemoglobin: 9.6 g/dL — ABNORMAL LOW (ref 12.0–15.0)
MCH: 28.8 pg (ref 26.0–34.0)
MCH: 28.7 pg (ref 26.0–34.0)
MCH: 28.5 pg (ref 26.0–34.0)
MCHC: 30.7 g/dL (ref 30.0–36.0)
MCHC: 31.3 g/dL (ref 30.0–36.0)
MCHC: 31.3 g/dL (ref 30.0–36.0)
MCV: 93.7 fL (ref 80.0–100.0)
MCV: 91.6 fL (ref 80.0–100.0)
MCV: 91.1 fL (ref 80.0–100.0)
Platelets: 292 K/uL (ref 150–400)
Platelets: 379 K/uL (ref 150–400)
Platelets: 352 K/uL (ref 150–400)
RBC: 3.16 MIL/uL — ABNORMAL LOW (ref 3.87–5.11)
RBC: 3.56 MIL/uL — ABNORMAL LOW (ref 3.87–5.11)
RBC: 3.37 MIL/uL — ABNORMAL LOW (ref 3.87–5.11)
RDW: 13.5 % (ref 11.5–15.5)
RDW: 13.3 % (ref 11.5–15.5)
RDW: 13.3 % (ref 11.5–15.5)
WBC: 6.5 K/uL (ref 4.0–10.5)
WBC: 9.2 K/uL (ref 4.0–10.5)
WBC: 8.3 K/uL (ref 4.0–10.5)
nRBC: 0 % (ref 0.0–0.2)
nRBC: 0 % (ref 0.0–0.2)
nRBC: 0 % (ref 0.0–0.2)

## 2024-12-18 MED ORDER — IOHEXOL 350 MG/ML SOLN
75.0000 mL | Freq: Once | INTRAVENOUS | Status: AC | PRN
  Administered 2024-12-18: 75 mL via INTRAVENOUS

## 2024-12-18 MED ORDER — LORAZEPAM 0.5 MG PO TABS: 0.5000 mg | ORAL_TABLET | Freq: Once | ORAL | Status: DC

## 2024-12-18 MED ORDER — ALPRAZOLAM 0.5 MG PO TABS
0.2500 mg | ORAL_TABLET | Freq: Two times a day (BID) | ORAL | Status: DC | PRN
  Administered 2024-12-18 – 2024-12-20 (×3): 0.25 mg via ORAL
  Filled 2024-12-18 (×3): qty 1

## 2024-12-18 MED ORDER — ALPRAZOLAM 0.5 MG PO TABS
0.2500 mg | ORAL_TABLET | Freq: Once | ORAL | Status: AC
  Administered 2024-12-18: 0.25 mg via ORAL
  Filled 2024-12-18: qty 1

## 2024-12-18 MED ORDER — SODIUM CHLORIDE 0.9 % IV SOLN: INTRAVENOUS | Status: DC

## 2024-12-18 MED ORDER — CIPROFLOXACIN IN D5W 400 MG/200ML IV SOLN
400.0000 mg | Freq: Once | INTRAVENOUS | Status: AC
  Administered 2024-12-18: 400 mg via INTRAVENOUS
  Filled 2024-12-18: qty 200

## 2024-12-18 MED ORDER — ACETAMINOPHEN 650 MG RE SUPP: 650.0000 mg | Freq: Four times a day (QID) | RECTAL | Status: DC | PRN

## 2024-12-18 MED ORDER — MORPHINE SULFATE (PF) 4 MG/ML IV SOLN
4.0000 mg | Freq: Once | INTRAVENOUS | Status: AC
  Administered 2024-12-18: 4 mg via INTRAVENOUS
  Filled 2024-12-18: qty 1

## 2024-12-18 MED ORDER — ONDANSETRON HCL 4 MG PO TABS: 4.0000 mg | ORAL_TABLET | Freq: Four times a day (QID) | ORAL | Status: DC | PRN

## 2024-12-18 MED ORDER — METRONIDAZOLE 500 MG/100ML IV SOLN
500.0000 mg | Freq: Once | INTRAVENOUS | Status: AC
  Administered 2024-12-18: 500 mg via INTRAVENOUS
  Filled 2024-12-18: qty 100

## 2024-12-18 MED ORDER — PRAMIPEXOLE DIHYDROCHLORIDE 0.25 MG PO TABS
0.2500 mg | ORAL_TABLET | Freq: Every day | ORAL | Status: DC
  Administered 2024-12-18 – 2024-12-19 (×2): 0.25 mg via ORAL
  Filled 2024-12-18 (×3): qty 1

## 2024-12-18 MED ORDER — BUPROPION HCL ER (XL) 300 MG PO TB24
300.0000 mg | ORAL_TABLET | Freq: Every morning | ORAL | Status: DC
  Administered 2024-12-18 – 2024-12-20 (×3): 300 mg via ORAL
  Filled 2024-12-18: qty 2
  Filled 2024-12-18 (×2): qty 1

## 2024-12-18 MED ORDER — ONDANSETRON HCL 4 MG/2ML IJ SOLN
4.0000 mg | Freq: Four times a day (QID) | INTRAMUSCULAR | Status: DC | PRN
  Administered 2024-12-18: 4 mg via INTRAVENOUS
  Filled 2024-12-18: qty 2

## 2024-12-18 MED ORDER — PANTOPRAZOLE SODIUM 40 MG IV SOLR
40.0000 mg | Freq: Two times a day (BID) | INTRAVENOUS | Status: DC
  Administered 2024-12-18 – 2024-12-20 (×5): 40 mg via INTRAVENOUS
  Filled 2024-12-18 (×5): qty 10

## 2024-12-18 MED ORDER — ACETAMINOPHEN 325 MG PO TABS
650.0000 mg | ORAL_TABLET | Freq: Four times a day (QID) | ORAL | Status: DC | PRN
  Administered 2024-12-18 (×2): 650 mg via ORAL
  Filled 2024-12-18 (×2): qty 2

## 2024-12-18 NOTE — Consult Note (Addendum)
 "                                               Consultation Note   Referring Provider:   [facility] PCP: Claudene Lacks, MD Primary Gastroenterologist:  Previously Toribio Cedar, MD  **Following this consult it was determined that patient has established care with Dr. Saintclair at [facility] Reason for Consultation:  Blood in stool DOA: 12/17/2024         Hospital Day: 2   Assessment and Plan:  64 yo female with GI bleed ( burgundy blood in stool)  in setting of Celebrex  and asa.  Hemodynamically stable.  Presenting hemoglobin 11 .  No recent labs to compare but hemoglobin was 14 in 2022 .  Her BUN .  Rule out PUD.  Lower GI bleed also possible.  Could  have been a self-limited diverticular hemorrhage.  CT scan showing uncomplicated diverticulitis but that isn't typically  associated with bleeding.   Hemoglobin down slightly since admission 11 >>10 but no further bleeding /no BMs today.   Monitor H&H Continue twice daily IV PPI Will probably need upper endoscopy tomorrow, will discuss with Dr. Stacia.  Continue clear liquid diet today  Diverticulosis Uncomplicated diverticulitis on CT scan Chronic lower abdominal abdominal discomfort.  Difficult what to make of CT scan findings in . She has chronic lower abdominal discomfort and other than some recent worsening lower abdominal pressure / burning she hasn't had any significant abdominal pain. WBC is normal and abdomen not particularly tender on exam.  Continue Cipro  / Flagyl  Colonoscopy by our practice done in 2009. Patient  patient thinks she may have had a more recent one elsewhere , along with an EGD but doesn't know where. She will try to obtain that information.If no colonoscopy within the last few years she will need one   History of bulimia  Anxiety  Mild esophageal dysmotility Chronic intermittent esophageal discomfort Reported history of esophageal spasms  See PMH for any additional medical history  / medical  problems  Principal Problem:   Hematochezia   History of Present Illness: Patient presented to the ED yesterday for evaluation of blood in stool , lightheadedness.  Patient has a history of chronic lower abdominal for constipation.  She has to take MiraLAX or something on a regular basis or she will not be able to move her bowels.  She had knee surgery in mid December, following that she was taking Celebrex  and aspirin .  Recently noticed increasing generalized lower abdominal burning and pressure.  Frequently has the same symptoms when she has a UTI but was not having any dysuria.  Yesterday she began passing burgundy blood and bowel movements.  She had several episodes.  She developed lightheadedness and went to urgent care who subsequently sent her to the ED .  Workup In the ED she was hemodynamically stable.  WBC 6.5 , hemoglobin 11, down from 14 in 2022 (nothing more recent to compare).  BUN 16, lipase normal, LFTs normal  CTAP with contrast Moderate sigmoid diverticulosis.Superimposed pericolonic inflammatory stranding involving the mid sigmoid colon in keeping with mild, uncomplicated sigmoid diverticulitis.  Most recent Endoscopic Procedures:   June 2009 colonoscopy Done for abdominal pain and bloating Prep: Good Diverticulosis: Ascending colon to sigmoid colon..  A few small external hemorrhoids  Recent Labs    12/17/24 1513  PROT  6.7  ALBUMIN 4.0  AST 33  ALT 16  ALKPHOS 86  BILITOT 0.3   Recent Labs    12/17/24 1513 12/18/24 0015 12/18/24 0506 12/18/24 1211  WBC 9.5  --  6.5 9.2  HGB 11.0* 10.6* 9.1* 10.2*  HCT 35.9* 33.8* 29.6* 32.6*  MCV 92.3  --  93.7 91.6  PLT 438*  --  292 379   Recent Labs    12/17/24 1513  NA 138  K 5.0  CL 105  CO2 24  GLUCOSE 97  BUN 16  CREATININE 0.86  CALCIUM 9.2     Review of Systems: Positive for anxiety. All systems reviewed and negative except where noted in HPI.  Physical Exam: Vital signs in last 24  hours: Temp:  [97.8 F (36.6 C)-98.4 F (36.9 C)] 98.3 F (36.8 C) (01/10 1207) Pulse Rate:  [71-111] 92 (01/10 1207) Resp:  [12-20] 18 (01/10 1207) BP: (95-163)/(53-92) 127/80 (01/10 1207) SpO2:  [97 %-100 %] 99 % (01/10 1207) Weight:  [63.5 kg] 63.5 kg (01/09 1511)   General:  Pleasant female in NAD Psych:  Cooperative. Normal mood and affect Eyes: Pupils equal Ears:  Normal auditory acuity Nose: No deformity, discharge or lesions Neck:  Supple, no masses felt Lungs:  Clear to auscultation.  Heart:  Regular rate, regular rhythm.  Abdomen:  Soft, nondistended, nontender, active bowel sounds, no masses felt Rectal :  Deferred Msk: Symmetrical without gross deformities.  Neurologic:  Alert, oriented, grossly normal neurologically Extremities : No edema Skin:  Intact without significant lesions.   OUTPATIENT MEDICATIONS Prior to Admission medications  Medication Sig Start Date End Date Taking? Authorizing Provider  albuterol (VENTOLIN HFA) 108 (90 Base) MCG/ACT inhaler Inhale 2 puffs into the lungs every 4 (four) hours as needed. 04/09/23  Yes [provider]  ALPRAZolam  (XANAX ) 0.25 MG tablet Take by mouth at bedtime as needed for sleep or anxiety. 08/25/20  Yes [provider]  aspirin  EC 81 MG tablet Take 81 mg by mouth daily. Swallow whole.   Yes [provider]  Bacillus Coagulans-Inulin (ALIGN PREBIOTIC-PROBIOTIC PO) Take 1 tablet by mouth daily.   Yes [provider]  BREO ELLIPTA 200-25 MCG/INH AEPB Inhale 1 puff into the lungs daily. 12/21/20  Yes [provider]  buPROPion  (WELLBUTRIN  XL) 300 MG 24 hr tablet Take 300 mg by mouth daily.   Yes [provider]  celecoxib  (CELEBREX ) 200 MG capsule Take 1 capsule (200 mg total) by mouth daily with meals. 12/15/24  Yes   cetirizine (ZYRTEC) 10 MG tablet Take 10 mg by mouth as needed. 08/22/20  Yes [provider]  Ferrous Sulfate (IRON PO) Take 65 mg by mouth in the  morning and at bedtime.   Yes [provider]  FIBER GUMMIES PO Take 4 tablets by mouth at bedtime.   Yes [provider]  glycerin adult 2 g suppository Place 1 suppository rectally as needed for constipation.   Yes [provider]  HYDROcodone -acetaminophen  (NORCO/VICODIN) 5-325 MG tablet Take 1 tablet by mouth every 4 (four) hours as needed for severe post op pain 12/13/24  Yes   methocarbamol  (ROBAXIN ) 500 MG tablet Take 1 tablet (500 mg total) by mouth every 6 (six) hours as needed for muscle spasm/muscle pain. 12/15/24  Yes   polyethylene glycol powder (GLYCOLAX/MIRALAX) 17 GM/SCOOP powder Take 17 g by mouth 2 (two) times daily. 11/07/24  Yes [provider]  pramipexole  (MIRAPEX ) 0.5 MG tablet Take 0.25 mg by mouth  at bedtime. 03/12/22  Yes [provider]  RABEprazole (ACIPHEX) 20 MG tablet Take 20 mg by mouth daily.   Yes [provider]  senna (SENOKOT) 8.6 MG tablet Take 2 tablets (17.2 mg total) by mouth at bedtime for 14 days. 12/15/24  Yes   tretinoin (RETIN-A) 0.1 % cream Apply 1 Application topically daily. 09/15/24  Yes [provider]  VITAMIN D  PO Take 1 capsule by mouth daily.   Yes [provider]    Allergies as of 12/17/2024 - Review Complete 12/17/2024  Allergen Reaction Noted   Buspirone Other (See Comments) 09/15/2020   Celexa [citalopram]  03/08/2022   Cephalexin     Clonazepam  09/15/2020   Decongestant [pseudoephedrine hcl er]  08/17/2013   Erythromycin     Insulins  02/08/2021   Lorazepam   09/15/2020   Mupirocin  03/08/2022   Neomycin  03/08/2022   Other Other (See Comments) and Itching 09/15/2020   Petrol dist-piperonyl butoxide-pyrethrins [pyrethrins-piperonyl butoxide]     Pramipexole  dihydrochloride  09/15/2020   Promethazine hcl     Restasis [cyclosporine]     Zoloft [sertraline]  03/08/2022   Bacitracin-neomycin-polymyxin Rash 04/13/2008    INPATIENT MEDICATIONS Current  Facility-Administered Medications  Medication Dose Route Frequency Provider Last Rate Last Admin   acetaminophen  (TYLENOL ) tablet 650 mg  650 mg Oral Q6H PRN Dena Charleston, MD   650 mg at 12/18/24 9356   Or   acetaminophen  (TYLENOL ) suppository 650 mg  650 mg Rectal Q6H PRN Dena Charleston, MD       ALPRAZolam  (XANAX ) tablet 0.25 mg  0.25 mg Oral BID PRN Dena Charleston, MD   0.25 mg at 12/18/24 9356   ALPRAZolam  (XANAX ) tablet 0.25 mg  0.25 mg Oral Once Imam, Shehab F, DO       buPROPion  (WELLBUTRIN  XL) 24 hr tablet 300 mg  300 mg Oral q morning Dena Charleston, MD   300 mg at 12/18/24 9082   ondansetron  (ZOFRAN ) tablet 4 mg  4 mg Oral Q6H PRN Dena Charleston, MD       Or   ondansetron  (ZOFRAN ) injection 4 mg  4 mg Intravenous Q6H PRN Dena Charleston, MD   4 mg at 12/18/24 9266   pantoprazole  (PROTONIX ) injection 40 mg  40 mg Intravenous Q12H Imam, Shehab F, DO       pramipexole  (MIRAPEX ) tablet 0.25 mg  0.25 mg Oral QHS Dena Charleston, MD         Past Medical History:  Diagnosis Date   Allergic rhinitis    Anxiety    Arthritis    Asthma    Broken collarbone 2022   GERD (gastroesophageal reflux disease)    IBS (irritable bowel syndrome)    Insomnia    Iron deficiency anemia    Osteoarthritis    Osteoporosis 10/2019   T score -2.6 DEXA 2011 with T score -2.6, follow-up DEXA 2020 T score -2.1   Plantar fasciitis    Radial head fracture    Restless leg     Past Surgical History:  Procedure Laterality Date   APPENDECTOMY     ARTERY BIOPSY Left 10/08/2018   Procedure: BIOPSY LEFT  TEMPORAL ARTERY;  Surgeon: Vernetta Berg, MD;  Location: [facility];  Service: General;  Laterality: Left;   ERAS PATHWAY   bladder distention  2009   BREAST EXCISIONAL BIOPSY Bilateral    CALCANEAL OSTEOTOMY Right 01/01/2018   Procedure: Right Lateral Calcaneal Osteotomy;  Surgeon: Kit Rush, MD;  Location: [facility]  [place];  Service: Orthopedics;  Laterality: Right;    HAND SURGERY Left 11/16/2020   KNEE SURGERY  03/2004   KNEE SURGERY  2016   METATARSAL OSTEOTOMY Right 01/01/2018   Procedure: Dorsiflexion Osteotomy of the First Metatarsal; Plantar Fascia Release;  Surgeon: Kit Rush, MD;  Location: [facility];  Service: Orthopedics;  Laterality: Right;   OOPHORECTOMY     laparoscopic BSO for benign cysts   REFRACTIVE SURGERY  2016   REPAIR OF RECTOCELE     TENDON TRANSFER Right 01/01/2018   Procedure: Right Peroneus Longus Tendon Debridement and Tenolysis; Transfer of Peroneus Brevis to Peroneus Longus Tendon;  Surgeon: Kit Rush, MD;  Location: [facility];  Service: Orthopedics;  Laterality: Right;   VAGINAL HYSTERECTOMY     early 2000's for uterine prolapse and rectocele    Family History  Problem Relation Age of Onset   Hypertension Mother    Heart failure Mother    Diabetes Father    Hypertension Father    Heart disease Father    Cancer Father        Prostate   Hypertension Sister    Cancer Sister        Melanoma   Heart disease Paternal Uncle    Diabetes Maternal Grandmother    Heart disease Maternal Grandmother    Diabetes Maternal Grandfather    Heart disease Maternal Grandfather    Diabetes Paternal Grandmother    Heart disease Paternal Grandmother    Diabetes Paternal Grandfather    Heart disease Paternal Grandfather     Social History   Socioeconomic History   Marital status: Married    Spouse name: Not on file   Number of children: Not on file   Years of education: Not on file   Highest education level: Not on file  Occupational History   Not on file  Tobacco Use   Smoking status: Never   Smokeless tobacco: Never  Vaping Use   Vaping status: Never Used  Substance and Sexual Activity   Alcohol use: Not Currently    Alcohol/week: 0.0 standard drinks of alcohol   Drug use: No   Sexual activity: Yes    Partners: Male    Birth control/protection: Surgical    Comment:  hysterectomy  Other Topics Concern   Not on file  Social History Narrative   Not on file   Social Drivers of Health   Tobacco Use: Low Risk (12/17/2024)   Patient History    Smoking Tobacco Use: Never    Smokeless Tobacco Use: Never    Passive Exposure: Not on file  Financial Resource Strain: Not on file  Food Insecurity: No Food Insecurity (12/18/2024)   Epic    Worried About Programme Researcher, Broadcasting/film/video in the Last Year: Never true    Ran Out of Food in the Last Year: Never true  Transportation Needs: No Transportation Needs (12/18/2024)   Epic    Lack of Transportation (Medical): No    Lack of Transportation (Non-Medical): No  Physical Activity: Not on file  Stress: Not on file (10/15/2023)  Social Connections: Not on file  Intimate Partner Violence: Not At Risk (12/18/2024)   Epic    Fear of Current or Ex-Partner: No    Emotionally Abused: No    Physically Abused: No    Sexually Abused: No  Depression (PHQ2-9): Low Risk (05/24/2024)   Depression (PHQ2-9)    PHQ-2 Score: 0  Alcohol Screen: Not on file  Housing: Low Risk (12/18/2024)   Epic    Unable to Pay for Housing in the Last Year: No    Number of Times Moved in the Last Year: 0    Homeless in the Last Year: No  Utilities: Not At Risk (12/18/2024)   Epic    Threatened with loss of utilities: No  Health Literacy: Not on file    Code Status   Code Status: Full Code   Vina Dasen, NP-C   12/18/2024, 12:43 PM  --------------------------------------------------  I have taken a history, reviewed the chart and examined the patient. I performed a substantive portion of this encounter, including complete performance of at least one of the key components, in conjunction with the APP. I agree with the APP's note, impression and recommendations  64 year old female with recent total knee replacement, admitted with multiple episodes of dark stool with reddish tent in the setting of frequent NSAID use.  CT with questionable  diverticulitis, but patient does not have typical clinical history for diverticulitis. Patient showed me pictures of her stool which clearly appeared melenic. Her hemoglobin was 11, and drifted to 10.  She shows me her labs and her phone prior to her surgery which showed a hemoglobin of 14.  Some of her blood loss is most likely related to her recent surgery, but with her melanic appearing stools and 4 point drop in hemoglobin with risk factors of aspirin /Celebrex  use, an upper endoscopy seems very reasonable.  As mentioned, it was discovered after the initial consult that the patient follows with [facility] gastroenterology.  I spoke with Dr. Elicia and discussed the patient's case.  I recommended an upper endoscopy, and he agreed.  We will schedule the patient for an upper endoscopy tomorrow with Dr. Elicia.  Continue Protonix  IV twice daily, clear liquid diet tonight, n.p.o. after midnight  Can likely discontinue treatment for diverticulitis tomorrow if peptic ulcer disease found on EGD.  [facility]/Dr. Elicia will take over patient care tomorrow.  Phyllis Abelson E. Stacia, MD [facility]    "

## 2024-12-18 NOTE — Progress Notes (Signed)
 "                                                                                                                                                                                                                                                                                PROGRESS NOTE     Patient Demographics:    [name], is a 64 y.o. female, DOB - 1961/11/06, FMW:[id number]  Outpatient Primary MD for the patient is Claudene Lacks, MD    LOS - 0  Admit date - 12/17/2024    Chief Complaint  Patient presents with   Blood In Stools       Brief Narrative (HPI from H&P)    [name] is a 64 y.o. female with medical history significant of anxiety who presents emergency department hematochezia.  Patient had 4 bloody stools and started to feel tired and weak so she presented to emergency department.  She had a history of colonoscopy in 2019 which was reportedly normal.  On arrival to the ER she was febrile hematin medically stable.  Labs were obtained which demonstrated CMP unrevealing, lipase within normal limits, hemoglobin 11 baseline around 14, urinalysis negative for infection.  CT abdomen pelvis was obtained which showed sigmoid diverticulitis and diverticulosis.  Patient was admitted for further workup.    Subjective:   No acute events overnight, patient seen and evaluated at bedside.  No further episodes of hematochezia since last night.    Assessment  & Plan :   Assessment & Plan Hematochezia  #Acute on chronic anemia most likely secondary to diverticular hemorrhage Patient admitted for episode of hematochezia since yesterday.  Describes the BM as burgundy red and bright red blood.  Hemodynamically stable but hemoglobin continues to drop from 13 baseline to 9.1 this morning.  She had recent knee replacement surgery and admits to taking aspirin , Celebrex  since surgery. CT abdomen pelvis with evidence of diverticulosis.  - Will continue to trend hemoglobin every 8 hours -Continue  IV PPI 40 mg twice daily - Clear liquid diet - GI consulted  #GAD- Continue home Xanax , Wellbutrin    # RLS Discussed with patient to follow-up with sleep medicine to titrate off pramipexole . She  is in agreement.  - Will refer to sleep medicine at discharge   Patient Lines/Drains/Airways Status     Active Line/Drains/Airways     Name Placement date Placement time Site Days   Peripheral IV 12/18/24 20 G Left Antecubital 12/18/24  0014  Antecubital  less than 1   Wound 12/18/24 0154 Surgical Closed Surgical Incision Knee Anterior;Right 12/18/24  0154  Knee  less than 1              Nutrition Problem:        Obesity: Estimated body mass index is 25.6 kg/m as calculated from the following:   Height as of this encounter: 5' 2 (1.575 m).   Weight as of this encounter: 63.5 kg.          Condition - Guarded  Code Status : Full code  Consults  : GI  PUD Prophylaxis : PPI       Disposition Plan  :    Status is: Inpatient   DVT Prophylaxis  :    SCDs Start: 12/18/24 0404   Lab Results  Component Value Date   PLT 292 12/18/2024    Diet :  Diet Order             Diet clear liquid Room service appropriate? Yes; Fluid consistency: Thin  Diet effective now                    Inpatient Medications  Scheduled Meds:  buPROPion   300 mg Oral q morning   pramipexole   0.25 mg Oral QHS   Continuous Infusions: PRN Meds:.acetaminophen  **OR** acetaminophen , ALPRAZolam , ondansetron  **OR** ondansetron  (ZOFRAN ) IV  Antibiotics  :    Anti-infectives (From admission, onward)    Start     Dose/Rate Route Frequency Ordered Stop   12/18/24 0115  metroNIDAZOLE  (FLAGYL ) IVPB 500 mg        500 mg 100 mL/hr over 60 Minutes Intravenous  Once 12/18/24 0102 12/18/24 0240   12/18/24 0115  ciprofloxacin  (CIPRO ) IVPB 400 mg        400 mg 200 mL/hr over 60 Minutes Intravenous  Once 12/18/24 0102 12/18/24 0346         Objective:   Vitals:   12/18/24 0900  12/18/24 1034 12/18/24 1128 12/18/24 1130  BP: 102/62  (!) 130/59   Pulse: 76  88   Resp: 16  16   Temp:  98.1 F (36.7 C)  98 F (36.7 C)  TempSrc:  Oral  Oral  SpO2: 100%  100%   Weight:      Height:        Wt Readings from Last 3 Encounters:  12/17/24 63.5 kg  05/24/24 63.5 kg  12/09/23 68.3 kg    No intake or output data in the 24 hours ending 12/18/24 1148   Physical Exam Constitutional:      Appearance: She is normal weight.  HENT:     Nose: Nose normal.     Mouth/Throat:     Mouth: Mucous membranes are moist.     Pharynx: Oropharynx is clear.  Eyes:     Conjunctiva/sclera: Conjunctivae normal.     Pupils: Pupils are equal, round, and reactive to light.  Cardiovascular:     Rate and Rhythm: Normal rate.     Pulses: Normal pulses.     Heart sounds: Normal heart sounds.  Pulmonary:     Effort: Pulmonary effort is normal.     Breath sounds: Normal breath sounds.  Abdominal:     General: Abdomen is flat. Bowel sounds are normal.  Musculoskeletal:        General: Normal range of motion.     Cervical back: Normal range of motion.  Skin:    General: Skin is warm.     Capillary Refill: Capillary refill takes less than 2 seconds.  Neurological:     General: No focal deficit present.     Mental Status: She is alert. Mental status is at baseline.  Psychiatric:        Mood and Affect: Mood normal.   RN pressure injury documentation:      Data Review:    Recent Labs  Lab 12/17/24 1513 12/18/24 0015 12/18/24 0506  WBC 9.5  --  6.5  HGB 11.0* 10.6* 9.1*  HCT 35.9* 33.8* 29.6*  PLT 438*  --  292  MCV 92.3  --  93.7  MCH 28.3  --  28.8  MCHC 30.6  --  30.7  RDW 13.3  --  13.5  LYMPHSABS 1.6  --   --   MONOABS 0.6  --   --   EOSABS 0.4  --   --   BASOSABS 0.1  --   --     Recent Labs  Lab 12/17/24 1513  NA 138  K 5.0  CL 105  CO2 24  ANIONGAP 10  GLUCOSE 97  BUN 16  CREATININE 0.86  AST 33  ALT 16  ALKPHOS 86  BILITOT 0.3  ALBUMIN 4.0   CALCIUM 9.2      Recent Labs  Lab 12/17/24 1513  CALCIUM 9.2    --------------------------------------------------------------------------------------------------------------- Lab Results  Component Value Date   CHOL 218 (H) 01/04/2021   HDL 69 01/04/2021   LDLCALC 128 (H) 01/04/2021   TRIG 100 01/04/2021   CHOLHDL 3.2 01/04/2021    Lab Results  Component Value Date   HGBA1C 5.5 06/18/2017   No results for input(s): TSH, T4TOTAL, FREET4, T3FREE, THYROIDAB in the last 72 hours. No results for input(s): VITAMINB12, FOLATE, FERRITIN, TIBC, IRON, RETICCTPCT in the last 72 hours. ------------------------------------------------------------------------------------------------------------------ Cardiac Enzymes No results for input(s): CKMB, TROPONINI, MYOGLOBIN in the last 168 hours.  Invalid input(s): CK  Micro Results No results found for this or any previous visit (from the past 240 hours).  Radiology Report CT ABDOMEN PELVIS W CONTRAST Result Date: 12/18/2024 EXAM: CT ABDOMEN AND PELVIS WITH CONTRAST 12/18/2024 12:48:47 AM TECHNIQUE: CT of the abdomen and pelvis was performed with the administration of 75 mL of iohexol  (OMNIPAQUE ) 350 MG/ML injection. Multiplanar reformatted images are provided for review. Automated exposure control, iterative reconstruction, and/or weight-based adjustment of the mA/kV was utilized to reduce the radiation dose to as low as reasonably achievable. COMPARISON: None available. CLINICAL HISTORY: Abdominal pain, acute, nonlocalized; GI bleed protocol. FINDINGS: LOWER CHEST: No acute abnormality. LIVER: The liver is unremarkable. GALLBLADDER AND BILE DUCTS: Gallbladder is unremarkable. No biliary ductal dilatation. SPLEEN: No acute abnormality. PANCREAS: No acute abnormality. ADRENAL GLANDS: No acute abnormality. KIDNEYS, URETERS AND BLADDER: No stones in the kidneys or ureters. No hydronephrosis. No perinephric or  periureteral stranding. Urinary bladder is unremarkable. GI AND BOWEL: Stomach demonstrates no acute abnormality. Moderate sigmoid diverticulosis. Superimposed pericolonic inflammatory stranding involving the mid sigmoid colon in keeping with mild, uncomplicated sigmoid diverticulitis. No obstruction or perforation. No free intraperitoneal gas or fluid. No loculated pericolonic fluid collections. Appendix absent. The small bowel and large bowel are otherwise unremarkable. PERITONEUM AND RETROPERITONEUM: No ascites. No free air.  VASCULATURE: Aorta is normal in caliber. LYMPH NODES: No lymphadenopathy. REPRODUCTIVE ORGANS: Uterus absent. No adnexal masses. BONES AND SOFT TISSUES: Osseous structures are age appropriate. No acute bone abnormality. No lytic or blastic bone lesion. Tiny fat-containing right inguinal hernia. IMPRESSION: 1. Mild, uncomplicated sigmoid diverticulitis without obstruction or perforation. 2. Moderate sigmoid diverticulosis. 3. Status post hysterectomy and appendectomy. Electronically signed by: Dorethia Molt MD MD 12/18/2024 12:56 AM EST RP Workstation: [id number]     Signature  -   Moise FALCON Brae Schaafsma M.D on 12/18/2024 at 11:48 AM   -  To page go to [email or web address]   "

## 2024-12-18 NOTE — H&P (View-Only) (Signed)
 "                                               Consultation Note   Referring Provider:   [facility] PCP: Claudene Lacks, MD Primary Gastroenterologist:  Previously Toribio Cedar, MD  **Following this consult it was determined that patient has established care with Dr. Saintclair at [facility] Reason for Consultation:  Blood in stool DOA: 12/17/2024         Hospital Day: 2   Assessment and Plan:  64 yo female with GI bleed ( burgundy blood in stool)  in setting of Celebrex  and asa.  Hemodynamically stable.  Presenting hemoglobin 11 .  No recent labs to compare but hemoglobin was 14 in 2022 .  Her BUN .  Rule out PUD.  Lower GI bleed also possible.  Could  have been a self-limited diverticular hemorrhage.  CT scan showing uncomplicated diverticulitis but that isn't typically  associated with bleeding.   Hemoglobin down slightly since admission 11 >>10 but no further bleeding /no BMs today.   Monitor H&H Continue twice daily IV PPI Will probably need upper endoscopy tomorrow, will discuss with Dr. Stacia.  Continue clear liquid diet today  Diverticulosis Uncomplicated diverticulitis on CT scan Chronic lower abdominal abdominal discomfort.  Difficult what to make of CT scan findings in . She has chronic lower abdominal discomfort and other than some recent worsening lower abdominal pressure / burning she hasn't had any significant abdominal pain. WBC is normal and abdomen not particularly tender on exam.  Continue Cipro  / Flagyl  Colonoscopy by our practice done in 2009. Patient  patient thinks she may have had a more recent one elsewhere , along with an EGD but doesn't know where. She will try to obtain that information.If no colonoscopy within the last few years she will need one   History of bulimia  Anxiety  Mild esophageal dysmotility Chronic intermittent esophageal discomfort Reported history of esophageal spasms  See PMH for any additional medical history  / medical  problems  Principal Problem:   Hematochezia   History of Present Illness: Patient presented to the ED yesterday for evaluation of blood in stool , lightheadedness.  Patient has a history of chronic lower abdominal for constipation.  She has to take MiraLAX or something on a regular basis or she will not be able to move her bowels.  She had knee surgery in mid December, following that she was taking Celebrex  and aspirin .  Recently noticed increasing generalized lower abdominal burning and pressure.  Frequently has the same symptoms when she has a UTI but was not having any dysuria.  Yesterday she began passing burgundy blood and bowel movements.  She had several episodes.  She developed lightheadedness and went to urgent care who subsequently sent her to the ED .  Workup In the ED she was hemodynamically stable.  WBC 6.5 , hemoglobin 11, down from 14 in 2022 (nothing more recent to compare).  BUN 16, lipase normal, LFTs normal  CTAP with contrast Moderate sigmoid diverticulosis.Superimposed pericolonic inflammatory stranding involving the mid sigmoid colon in keeping with mild, uncomplicated sigmoid diverticulitis.  Most recent Endoscopic Procedures:   June 2009 colonoscopy Done for abdominal pain and bloating Prep: Good Diverticulosis: Ascending colon to sigmoid colon..  A few small external hemorrhoids  Recent Labs    12/17/24 1513  PROT  6.7  ALBUMIN 4.0  AST 33  ALT 16  ALKPHOS 86  BILITOT 0.3   Recent Labs    12/17/24 1513 12/18/24 0015 12/18/24 0506 12/18/24 1211  WBC 9.5  --  6.5 9.2  HGB 11.0* 10.6* 9.1* 10.2*  HCT 35.9* 33.8* 29.6* 32.6*  MCV 92.3  --  93.7 91.6  PLT 438*  --  292 379   Recent Labs    12/17/24 1513  NA 138  K 5.0  CL 105  CO2 24  GLUCOSE 97  BUN 16  CREATININE 0.86  CALCIUM 9.2     Review of Systems: Positive for anxiety. All systems reviewed and negative except where noted in HPI.  Physical Exam: Vital signs in last 24  hours: Temp:  [97.8 F (36.6 C)-98.4 F (36.9 C)] 98.3 F (36.8 C) (01/10 1207) Pulse Rate:  [71-111] 92 (01/10 1207) Resp:  [12-20] 18 (01/10 1207) BP: (95-163)/(53-92) 127/80 (01/10 1207) SpO2:  [97 %-100 %] 99 % (01/10 1207) Weight:  [63.5 kg] 63.5 kg (01/09 1511)   General:  Pleasant female in NAD Psych:  Cooperative. Normal mood and affect Eyes: Pupils equal Ears:  Normal auditory acuity Nose: No deformity, discharge or lesions Neck:  Supple, no masses felt Lungs:  Clear to auscultation.  Heart:  Regular rate, regular rhythm.  Abdomen:  Soft, nondistended, nontender, active bowel sounds, no masses felt Rectal :  Deferred Msk: Symmetrical without gross deformities.  Neurologic:  Alert, oriented, grossly normal neurologically Extremities : No edema Skin:  Intact without significant lesions.   OUTPATIENT MEDICATIONS Prior to Admission medications  Medication Sig Start Date End Date Taking? Authorizing Provider  albuterol (VENTOLIN HFA) 108 (90 Base) MCG/ACT inhaler Inhale 2 puffs into the lungs every 4 (four) hours as needed. 04/09/23  Yes [provider]  ALPRAZolam  (XANAX ) 0.25 MG tablet Take by mouth at bedtime as needed for sleep or anxiety. 08/25/20  Yes [provider]  aspirin  EC 81 MG tablet Take 81 mg by mouth daily. Swallow whole.   Yes [provider]  Bacillus Coagulans-Inulin (ALIGN PREBIOTIC-PROBIOTIC PO) Take 1 tablet by mouth daily.   Yes [provider]  BREO ELLIPTA 200-25 MCG/INH AEPB Inhale 1 puff into the lungs daily. 12/21/20  Yes [provider]  buPROPion  (WELLBUTRIN  XL) 300 MG 24 hr tablet Take 300 mg by mouth daily.   Yes [provider]  celecoxib  (CELEBREX ) 200 MG capsule Take 1 capsule (200 mg total) by mouth daily with meals. 12/15/24  Yes   cetirizine (ZYRTEC) 10 MG tablet Take 10 mg by mouth as needed. 08/22/20  Yes [provider]  Ferrous Sulfate (IRON PO) Take 65 mg by mouth in the  morning and at bedtime.   Yes [provider]  FIBER GUMMIES PO Take 4 tablets by mouth at bedtime.   Yes [provider]  glycerin adult 2 g suppository Place 1 suppository rectally as needed for constipation.   Yes [provider]  HYDROcodone -acetaminophen  (NORCO/VICODIN) 5-325 MG tablet Take 1 tablet by mouth every 4 (four) hours as needed for severe post op pain 12/13/24  Yes   methocarbamol  (ROBAXIN ) 500 MG tablet Take 1 tablet (500 mg total) by mouth every 6 (six) hours as needed for muscle spasm/muscle pain. 12/15/24  Yes   polyethylene glycol powder (GLYCOLAX/MIRALAX) 17 GM/SCOOP powder Take 17 g by mouth 2 (two) times daily. 11/07/24  Yes [provider]  pramipexole  (MIRAPEX ) 0.5 MG tablet Take 0.25 mg by mouth  at bedtime. 03/12/22  Yes [provider]  RABEprazole (ACIPHEX) 20 MG tablet Take 20 mg by mouth daily.   Yes [provider]  senna (SENOKOT) 8.6 MG tablet Take 2 tablets (17.2 mg total) by mouth at bedtime for 14 days. 12/15/24  Yes   tretinoin (RETIN-A) 0.1 % cream Apply 1 Application topically daily. 09/15/24  Yes [provider]  VITAMIN D  PO Take 1 capsule by mouth daily.   Yes [provider]    Allergies as of 12/17/2024 - Review Complete 12/17/2024  Allergen Reaction Noted   Buspirone Other (See Comments) 09/15/2020   Celexa [citalopram]  03/08/2022   Cephalexin     Clonazepam  09/15/2020   Decongestant [pseudoephedrine hcl er]  08/17/2013   Erythromycin     Insulins  02/08/2021   Lorazepam   09/15/2020   Mupirocin  03/08/2022   Neomycin  03/08/2022   Other Other (See Comments) and Itching 09/15/2020   Petrol dist-piperonyl butoxide-pyrethrins [pyrethrins-piperonyl butoxide]     Pramipexole  dihydrochloride  09/15/2020   Promethazine hcl     Restasis [cyclosporine]     Zoloft [sertraline]  03/08/2022   Bacitracin-neomycin-polymyxin Rash 04/13/2008    INPATIENT MEDICATIONS Current  Facility-Administered Medications  Medication Dose Route Frequency Provider Last Rate Last Admin   acetaminophen  (TYLENOL ) tablet 650 mg  650 mg Oral Q6H PRN Dena Charleston, MD   650 mg at 12/18/24 9356   Or   acetaminophen  (TYLENOL ) suppository 650 mg  650 mg Rectal Q6H PRN Dena Charleston, MD       ALPRAZolam  (XANAX ) tablet 0.25 mg  0.25 mg Oral BID PRN Dena Charleston, MD   0.25 mg at 12/18/24 9356   ALPRAZolam  (XANAX ) tablet 0.25 mg  0.25 mg Oral Once Imam, Shehab F, DO       buPROPion  (WELLBUTRIN  XL) 24 hr tablet 300 mg  300 mg Oral q morning Dena Charleston, MD   300 mg at 12/18/24 9082   ondansetron  (ZOFRAN ) tablet 4 mg  4 mg Oral Q6H PRN Dena Charleston, MD       Or   ondansetron  (ZOFRAN ) injection 4 mg  4 mg Intravenous Q6H PRN Dena Charleston, MD   4 mg at 12/18/24 9266   pantoprazole  (PROTONIX ) injection 40 mg  40 mg Intravenous Q12H Imam, Shehab F, DO       pramipexole  (MIRAPEX ) tablet 0.25 mg  0.25 mg Oral QHS Dena Charleston, MD         Past Medical History:  Diagnosis Date   Allergic rhinitis    Anxiety    Arthritis    Asthma    Broken collarbone 2022   GERD (gastroesophageal reflux disease)    IBS (irritable bowel syndrome)    Insomnia    Iron deficiency anemia    Osteoarthritis    Osteoporosis 10/2019   T score -2.6 DEXA 2011 with T score -2.6, follow-up DEXA 2020 T score -2.1   Plantar fasciitis    Radial head fracture    Restless leg     Past Surgical History:  Procedure Laterality Date   APPENDECTOMY     ARTERY BIOPSY Left 10/08/2018   Procedure: BIOPSY LEFT  TEMPORAL ARTERY;  Surgeon: Vernetta Berg, MD;  Location: [facility];  Service: General;  Laterality: Left;   ERAS PATHWAY   bladder distention  2009   BREAST EXCISIONAL BIOPSY Bilateral    CALCANEAL OSTEOTOMY Right 01/01/2018   Procedure: Right Lateral Calcaneal Osteotomy;  Surgeon: Kit Rush, MD;  Location: [facility]  [place];  Service: Orthopedics;  Laterality: Right;    HAND SURGERY Left 11/16/2020   KNEE SURGERY  03/2004   KNEE SURGERY  2016   METATARSAL OSTEOTOMY Right 01/01/2018   Procedure: Dorsiflexion Osteotomy of the First Metatarsal; Plantar Fascia Release;  Surgeon: Kit Rush, MD;  Location: [facility];  Service: Orthopedics;  Laterality: Right;   OOPHORECTOMY     laparoscopic BSO for benign cysts   REFRACTIVE SURGERY  2016   REPAIR OF RECTOCELE     TENDON TRANSFER Right 01/01/2018   Procedure: Right Peroneus Longus Tendon Debridement and Tenolysis; Transfer of Peroneus Brevis to Peroneus Longus Tendon;  Surgeon: Kit Rush, MD;  Location: [facility];  Service: Orthopedics;  Laterality: Right;   VAGINAL HYSTERECTOMY     early 2000's for uterine prolapse and rectocele    Family History  Problem Relation Age of Onset   Hypertension Mother    Heart failure Mother    Diabetes Father    Hypertension Father    Heart disease Father    Cancer Father        Prostate   Hypertension Sister    Cancer Sister        Melanoma   Heart disease Paternal Uncle    Diabetes Maternal Grandmother    Heart disease Maternal Grandmother    Diabetes Maternal Grandfather    Heart disease Maternal Grandfather    Diabetes Paternal Grandmother    Heart disease Paternal Grandmother    Diabetes Paternal Grandfather    Heart disease Paternal Grandfather     Social History   Socioeconomic History   Marital status: Married    Spouse name: Not on file   Number of children: Not on file   Years of education: Not on file   Highest education level: Not on file  Occupational History   Not on file  Tobacco Use   Smoking status: Never   Smokeless tobacco: Never  Vaping Use   Vaping status: Never Used  Substance and Sexual Activity   Alcohol use: Not Currently    Alcohol/week: 0.0 standard drinks of alcohol   Drug use: No   Sexual activity: Yes    Partners: Male    Birth control/protection: Surgical    Comment:  hysterectomy  Other Topics Concern   Not on file  Social History Narrative   Not on file   Social Drivers of Health   Tobacco Use: Low Risk (12/17/2024)   Patient History    Smoking Tobacco Use: Never    Smokeless Tobacco Use: Never    Passive Exposure: Not on file  Financial Resource Strain: Not on file  Food Insecurity: No Food Insecurity (12/18/2024)   Epic    Worried About Programme Researcher, Broadcasting/film/video in the Last Year: Never true    Ran Out of Food in the Last Year: Never true  Transportation Needs: No Transportation Needs (12/18/2024)   Epic    Lack of Transportation (Medical): No    Lack of Transportation (Non-Medical): No  Physical Activity: Not on file  Stress: Not on file (10/15/2023)  Social Connections: Not on file  Intimate Partner Violence: Not At Risk (12/18/2024)   Epic    Fear of Current or Ex-Partner: No    Emotionally Abused: No    Physically Abused: No    Sexually Abused: No  Depression (PHQ2-9): Low Risk (05/24/2024)   Depression (PHQ2-9)    PHQ-2 Score: 0  Alcohol Screen: Not on file  Housing: Low Risk (12/18/2024)   Epic    Unable to Pay for Housing in the Last Year: No    Number of Times Moved in the Last Year: 0    Homeless in the Last Year: No  Utilities: Not At Risk (12/18/2024)   Epic    Threatened with loss of utilities: No  Health Literacy: Not on file    Code Status   Code Status: Full Code   Vina Dasen, NP-C   12/18/2024, 12:43 PM  --------------------------------------------------  I have taken a history, reviewed the chart and examined the patient. I performed a substantive portion of this encounter, including complete performance of at least one of the key components, in conjunction with the APP. I agree with the APP's note, impression and recommendations  64 year old female with recent total knee replacement, admitted with multiple episodes of dark stool with reddish tent in the setting of frequent NSAID use.  CT with questionable  diverticulitis, but patient does not have typical clinical history for diverticulitis. Patient showed me pictures of her stool which clearly appeared melenic. Her hemoglobin was 11, and drifted to 10.  She shows me her labs and her phone prior to her surgery which showed a hemoglobin of 14.  Some of her blood loss is most likely related to her recent surgery, but with her melanic appearing stools and 4 point drop in hemoglobin with risk factors of aspirin /Celebrex  use, an upper endoscopy seems very reasonable.  As mentioned, it was discovered after the initial consult that the patient follows with [facility] gastroenterology.  I spoke with Dr. Elicia and discussed the patient's case.  I recommended an upper endoscopy, and he agreed.  We will schedule the patient for an upper endoscopy tomorrow with Dr. Elicia.  Continue Protonix  IV twice daily, clear liquid diet tonight, n.p.o. after midnight  Can likely discontinue treatment for diverticulitis tomorrow if peptic ulcer disease found on EGD.  [facility]/Dr. Elicia will take over patient care tomorrow.  Phyllis Abelson E. Stacia, MD [facility]    "

## 2024-12-18 NOTE — Evaluation (Signed)
 Physical Therapy Brief Evaluation and Discharge Note Patient Details Name: [name] MRN: [id number] DOB: 1961-10-31 Today's Date: 12/18/2024   History of Present Illness  64 yo female admitted 1/9 with GI bleed ( burgundy blood in stool)  in setting of Celebrex  and asa. Diverticulosis. Recent right TKA in December. PMH: anxiety, bulimia, Mild esophageal dysmotility.   Clinical Impression  Patient evaluated by Physical Therapy with no further acute PT needs identified. All education has been completed and the patient has no further questions. Patient was progressing well with OPPT following recent Rt TKA in December. Had a minor set-back with ROM due to soreness but today is >90 degrees flexion on Rt and reports gradually improving again. She was able to mobilize throughout therapy session at an independent level with minor gait asymmetry but no LOB or buckling. Educated on resuming HEP as tolerated, low load long duration stretch several times/day for Rt knee, and mobilizing frequently with staff during admission. She demonstrates good awareness of s/s of anemia and we reviewed safety with mobility while recovering from current illness. All questions answered.  See below for any follow-up Physical Therapy or equipment needs. PT is signing off. Thank you for this referral.        PT Assessment Patient does not need any further PT services  Assistance Needed at Discharge  None    Equipment Recommendations None recommended by PT  Recommendations for Other Services       Precautions/Restrictions Precautions Precautions: Knee Recall of Precautions/Restrictions: Intact Restrictions Weight Bearing Restrictions Per Provider Order: No        Mobility  Bed Mobility       General bed mobility comments: In recliner when PT entered room  Transfers Overall transfer level: Independent                      Ambulation/Gait Ambulation/Gait assistance: Independent Gait  Distance (Feet): 250 Feet Assistive device: None Gait Pattern/deviations: Step-through pattern, Decreased step length - right Gait Speed: Pace WFL General Gait Details: Grossly stable with mild gait deficits noted consistent with recent TKA on rt. Educated on gait symmetry and awareness. Feels that she does limp at times. Encouraged SPC use to maintain symmetry as needed (when pain or stiffness is increased.)  Home Activity Instructions Home Activity Instructions: Resume activity as tolerated per OPPT in regard to Rt TKA. Remain aware of s/s of anemia and modify activities as needed for safety and efficiency.  Stairs Stairs:  (Declines, verbally aware of sequencing after TKA and has been managing well at home.)          Modified Rankin (Stroke Patients Only)        Balance Overall balance assessment: Independent                        Pertinent Vitals/Pain   Pain Assessment Pain Assessment: Faces Faces Pain Scale: Hurts a little bit Pain Location: Rt knee, back, sciatic type pain reported. Pain Descriptors / Indicators: Aching Pain Intervention(s): Limited activity within patient's tolerance, Repositioned, Monitored during session     Home Living Family/patient expects to be discharged to:: Private residence Living Arrangements: Spouse/significant other Available Help at Discharge: Family         Additional Comments: Currently completing OPPT for Rt knee TKA. Making good progress with ROM up to 124 deg flexion, but after a couple of limited days back down into 90s. Feels she is progressing well again.  Prior Function Level of Independence: Independent Comments: Has weaned off cane following TKA. Reports some lightheadedness  intermittently but no syncopal, or near syncopal episodes. Feels she has been making good progress with OPPT.    UE/LE Assessment        LE ROM/Strength/Tone/Coordination: Impaired LE ROM/Strength/Tone/Coordination Deficits:  (Rt  knee flexion >90 assessed visually in seated position.)    Communication   Communication Communication: No apparent difficulties     Cognition Overall Cognitive Status: Appears within functional limits for tasks assessed/performed       General Comments General comments (skin integrity, edema, etc.): Incision Rt knee clean, skin dry, no drainage, no overt erythema noted. Rt knee flexes >90 deg.    Exercises Other Exercises Other Exercises: Low load long duration stretches reviewed for Rt knee, seated in chair (aim for 5 min 3x/day and work up in duration and frequency as symptoms allow, maintaining mild discomfort levels. Seated ankle pumps x10, knees extended. Resume OPPT exercises as tolerated.   Assessment/Plan    PT Problem List         PT Visit Diagnosis Other abnormalities of gait and mobility (R26.89);Pain    No Skilled PT All education completed;Patient at baseline level of functioning;Patient will have necessary level of assist by caregiver at discharge;Patient is independent with all acitivity/mobility   Co-evaluation                AMPAC 6 Clicks Help needed turning from your back to your side while in a flat bed without using bedrails?: None Help needed moving from lying on your back to sitting on the side of a flat bed without using bedrails?: None Help needed moving to and from a bed to a chair (including a wheelchair)?: None Help needed standing up from a chair using your arms (e.g., wheelchair or bedside chair)?: None Help needed to walk in hospital room?: None Help needed climbing 3-5 steps with a railing? : None 6 Click Score: 24      End of Session Equipment Utilized During Treatment: Gait belt Activity Tolerance: Patient tolerated treatment well Patient left: in chair;with call bell/phone within reach Nurse Communication: Mobility status PT Visit Diagnosis: Other abnormalities of gait and mobility (R26.89);Pain Pain - Right/Left: Right Pain  - part of body: Knee     Time: 8656-8643 PT Time Calculation (min) (ACUTE ONLY): 13 min  Charges:   PT Evaluation $PT Eval Low Complexity: 1 Low      Leontine Roads, PT, DPT [facility]  Rehabilitation Services Physical Therapist Office: [phone] Website: [email or web address]   Leontine GORMAN Roads  12/18/2024, 2:32 PM

## 2024-12-18 NOTE — Plan of Care (Signed)
  Problem: Education: Goal: Knowledge of General Education information will improve Description: Including pain rating scale, medication(s)/side effects and non-pharmacologic comfort measures Outcome: Progressing   Problem: Clinical Measurements: Goal: Ability to maintain clinical measurements within normal limits will improve Outcome: Progressing Goal: Will remain free from infection Outcome: Progressing Goal: Diagnostic test results will improve Outcome: Progressing Goal: Respiratory complications will improve Outcome: Progressing Goal: Cardiovascular complication will be avoided Outcome: Progressing   Problem: Elimination: Goal: Will not experience complications related to bowel motility Outcome: Progressing Goal: Will not experience complications related to urinary retention Outcome: Progressing   

## 2024-12-18 NOTE — H&P (Signed)
 " History and Physical    [name] FMW:[id number] DOB: 14-May-1961 DOA: 12/17/2024  PCP: [name] Lacks, MD   Chief Complaint:  hematochezia  HPI: [name] is a 64 y.o. female with medical history significant of anxiety who presents emergency department hematochezia.  Patient had 4 bloody stools and started to feel tired and weak so she presented to emergency department.  She had a history of colonoscopy in 2019 which was reportedly normal.  On arrival to the ER she was febrile hematin medically stable.  Labs were obtained which demonstrated CMP unrevealing, lipase within normal limits, hemoglobin 11 baseline around 14, urinalysis negative for infection.  CT abdomen pelvis was obtained which showed sigmoid diverticulitis and diverticulosis.  Patient was admitted for further workup.   Review of Systems: Review of Systems  All other systems reviewed and are negative.    As per HPI otherwise 10 point review of systems negative.   Allergies[1]  Past Medical History:  Diagnosis Date   Allergic rhinitis    Anxiety    Arthritis    Asthma    Broken collarbone 2022   GERD (gastroesophageal reflux disease)    IBS (irritable bowel syndrome)    Insomnia    Iron deficiency anemia    Osteoarthritis    Osteoporosis 10/2019   T score -2.6 DEXA 2011 with T score -2.6, follow-up DEXA 2020 T score -2.1   Plantar fasciitis    Radial head fracture    Restless leg     Past Surgical History:  Procedure Laterality Date   APPENDECTOMY     ARTERY BIOPSY Left 10/08/2018   Procedure: BIOPSY LEFT  TEMPORAL ARTERY;  Surgeon: Vernetta Berg, MD;  Location: [facility];  Service: General;  Laterality: Left;   ERAS PATHWAY   bladder distention  2009   BREAST EXCISIONAL BIOPSY Bilateral    CALCANEAL OSTEOTOMY Right 01/01/2018   Procedure: Right Lateral Calcaneal Osteotomy;  Surgeon: Kit Rush, MD;  Location: [facility];  Service: Orthopedics;  Laterality: Right;    HAND SURGERY Left 11/16/2020   KNEE SURGERY  03/2004   KNEE SURGERY  2016   METATARSAL OSTEOTOMY Right 01/01/2018   Procedure: Dorsiflexion Osteotomy of the First Metatarsal; Plantar Fascia Release;  Surgeon: Kit Rush, MD;  Location: [facility];  Service: Orthopedics;  Laterality: Right;   OOPHORECTOMY     laparoscopic BSO for benign cysts   REFRACTIVE SURGERY  2016   REPAIR OF RECTOCELE     TENDON TRANSFER Right 01/01/2018   Procedure: Right Peroneus Longus Tendon Debridement and Tenolysis; Transfer of Peroneus Brevis to Peroneus Longus Tendon;  Surgeon: Kit Rush, MD;  Location: [facility];  Service: Orthopedics;  Laterality: Right;   VAGINAL HYSTERECTOMY     early 2000's for uterine prolapse and rectocele     reports that she has never smoked. She has never used smokeless tobacco. She reports that she does not currently use alcohol. She reports that she does not use drugs.  Family History  Problem Relation Age of Onset   Hypertension Mother    Heart failure Mother    Diabetes Father    Hypertension Father    Heart disease Father    Cancer Father        Prostate   Hypertension Sister    Cancer Sister        Melanoma   Heart disease Paternal Uncle    Diabetes Maternal Grandmother    Heart disease Maternal Grandmother  Diabetes Maternal Grandfather    Heart disease Maternal Grandfather    Diabetes Paternal Grandmother    Heart disease Paternal Grandmother    Diabetes Paternal Grandfather    Heart disease Paternal Grandfather     Prior to Admission medications  Medication Sig Start Date End Date Taking? Authorizing Provider  albuterol (VENTOLIN HFA) 108 (90 Base) MCG/ACT inhaler Inhale 2 puffs into the lungs every 4 (four) hours as needed. 04/09/23   [provider]  ALPRAZolam  (XANAX ) 0.25 MG tablet 1 tablet 08/25/20   [provider]  BREO ELLIPTA 200-25 MCG/INH AEPB Inhale 1 puff into the lungs as needed. 12/21/20    [provider]  buPROPion  (WELLBUTRIN  XL) 150 MG 24 hr tablet Take 150 mg by mouth every morning. 03/14/22   [provider]  celecoxib  (CELEBREX ) 200 MG capsule Take 1 capsule (200 mg total) by mouth daily with meals. 12/15/24     cetirizine (ZYRTEC) 10 MG tablet Take 10 mg by mouth as needed. 08/22/20   [provider]  clobetasol  ointment (TEMOVATE ) 0.05 % Apply in a thin layer twice daily for 2 weeks as needed for a flare of symptoms.  Apply in a thin layer twice a week at bedtime for maintenance dosing as needed. 05/24/24   Amundson C Silva, Brook E, MD  Ferrous Sulfate (IRON PO) Take 65 mg by mouth in the morning and at bedtime.    [provider]  HYDROcodone -acetaminophen  (NORCO/VICODIN) 5-325 MG tablet Take 1 tablet by mouth every 4 (four) hours as needed for severe post op pain 12/13/24     methocarbamol  (ROBAXIN ) 500 MG tablet Take 1 tablet (500 mg total) by mouth every 6 (six) hours as needed for muscle spasm/muscle pain. 12/15/24     pramipexole  (MIRAPEX ) 0.5 MG tablet Take 0.25 mg by mouth at bedtime. 03/12/22   [provider]  RABEprazole (ACIPHEX) 20 MG tablet Take 20 mg by mouth every other day.    [provider]  senna (SENOKOT) 8.6 MG tablet Take 2 tablets (17.2 mg total) by mouth at bedtime for 14 days. 12/15/24     tretinoin (RETIN-A) 0.05 % cream Apply topically. 03/10/24   [provider]  venlafaxine XR (EFFEXOR-XR) 75 MG 24 hr capsule Take 75 mg by mouth daily with breakfast. Patient not taking: Reported on 05/24/2024    [provider]  VITAMIN D  PO Take by mouth every other day.    [provider]    Physical Exam: Vitals:   12/17/24 1511 12/17/24 1951 12/17/24 2019 12/18/24 0003  BP:  114/68 111/70 129/72  Pulse:  98 96 91  Resp:   20 12  Temp:  98.3 F (36.8 C) 97.9 F (36.6 C) 97.8 F (36.6 C)  TempSrc:  Oral  Oral  SpO2:  98% 97% 100%  Weight: 63.5 kg     Height: 5' 2 (1.575 m)       Physical Exam Constitutional:      Appearance: She is normal weight.  HENT:     Nose: Nose normal.     Mouth/Throat:     Mouth: Mucous membranes are moist.     Pharynx: Oropharynx is clear.  Eyes:     Conjunctiva/sclera: Conjunctivae normal.     Pupils: Pupils are equal, round, and reactive to light.  Cardiovascular:     Rate and Rhythm: Normal rate.     Pulses: Normal pulses.     Heart sounds: Normal heart sounds.  Pulmonary:  Effort: Pulmonary effort is normal.     Breath sounds: Normal breath sounds.  Abdominal:     General: Abdomen is flat. Bowel sounds are normal.  Musculoskeletal:        General: Normal range of motion.     Cervical back: Normal range of motion.  Skin:    General: Skin is warm.     Capillary Refill: Capillary refill takes less than 2 seconds.  Neurological:     General: No focal deficit present.     Mental Status: She is alert. Mental status is at baseline.  Psychiatric:        Mood and Affect: Mood normal.       Labs on Admission: I have personally reviewed the patients's labs and imaging studies.  Assessment/Plan Principal Problem:   Hematochezia   #Acute on chronic anemia most likely secondary to diverticular hemorrhage - Patient CT imaging with sigmoid diverticulosis - Suspicion for diverticulitis is extremely low given presence of bleeding - Hemoglobin dropped from 13-10.6  Plan: Trend hemoglobin every 8 hours Clear liquid diet Once hemoglobin stable for 24 hours can likely be discharged with outpatient follow-up  #GAD-Continue home Xanax  Wellbutrin   # RLS-pramipexole     Admission status: Inpatient Med-Surg  Certification: The appropriate patient status for this patient is INPATIENT. Inpatient status is judged to be reasonable and necessary in order to provide the required intensity of service to ensure the patient's safety. The patient's presenting symptoms, physical exam findings, and initial radiographic and laboratory  data in the context of their chronic comorbidities is felt to place them at high risk for further clinical deterioration. Furthermore, it is not anticipated that the patient will be medically stable for discharge from the hospital within 2 midnights of admission.   * I certify that at the point of admission it is my clinical judgment that the patient will require inpatient hospital care spanning beyond 2 midnights from the point of admission due to high intensity of service, high risk for further deterioration and high frequency of surveillance required.DEWAINE Lamar Dess MD [facility] If 7PM-7AM, please contact night-coverage [email or web address]  12/18/2024, 4:04 AM        [1]  Allergies Allergen Reactions   Buspirone Other (See Comments)   Celexa [Citalopram]     Other reaction(s): breast soreness   Cephalexin    Clonazepam     Other reaction(s): blurred vision and dry eyes   Decongestant [Pseudoephedrine Hcl Er]     Heart races   Erythromycin    Insulins    Lorazepam      Other reaction(s): cognitive dysfunction   Mupirocin     Other reaction(s): contact drmatitis   Neomycin     Other reaction(s): contact dermatitis   Other Other (See Comments) and Itching    Other reaction(s): breast soreness Other reaction(s): heart racing   Petrol Dist-Piperonyl Butoxide-Pyrethrins [Pyrethrins-Piperonyl Butoxide]    Pramipexole  Dihydrochloride     Other reaction(s): cataplexy   Promethazine Hcl    Restasis [Cyclosporine]    Zoloft [Sertraline]     Other reaction(s): heart racing   Bacitracin-Neomycin-Polymyxin Rash    REACTION: RASH REACTION: RASH   "

## 2024-12-19 ENCOUNTER — Inpatient Hospital Stay (HOSPITAL_COMMUNITY): Admitting: Certified Registered"

## 2024-12-19 ENCOUNTER — Encounter (HOSPITAL_COMMUNITY): Payer: Self-pay | Admitting: Internal Medicine

## 2024-12-19 ENCOUNTER — Encounter: Payer: Self-pay | Attending: Emergency Medicine

## 2024-12-19 DIAGNOSIS — K2970 Gastritis, unspecified, without bleeding: Secondary | ICD-10-CM

## 2024-12-19 DIAGNOSIS — K449 Diaphragmatic hernia without obstruction or gangrene: Secondary | ICD-10-CM | POA: Diagnosis not present

## 2024-12-19 DIAGNOSIS — F419 Anxiety disorder, unspecified: Secondary | ICD-10-CM | POA: Diagnosis not present

## 2024-12-19 HISTORY — PX: BONE BIOPSY: SHX375

## 2024-12-19 HISTORY — PX: ESOPHAGOGASTRODUODENOSCOPY: SHX5428

## 2024-12-19 LAB — CBC
HCT: 31.8 % — ABNORMAL LOW (ref 36.0–46.0)
HCT: 31.9 % — ABNORMAL LOW (ref 36.0–46.0)
Hemoglobin: 10 g/dL — ABNORMAL LOW (ref 12.0–15.0)
Hemoglobin: 9.9 g/dL — ABNORMAL LOW (ref 12.0–15.0)
MCH: 28.7 pg (ref 26.0–34.0)
MCH: 28.4 pg (ref 26.0–34.0)
MCHC: 31.4 g/dL (ref 30.0–36.0)
MCHC: 31 g/dL (ref 30.0–36.0)
MCV: 91.1 fL (ref 80.0–100.0)
MCV: 91.7 fL (ref 80.0–100.0)
Platelets: 374 K/uL (ref 150–400)
Platelets: 356 K/uL (ref 150–400)
RBC: 3.49 MIL/uL — ABNORMAL LOW (ref 3.87–5.11)
RBC: 3.48 MIL/uL — ABNORMAL LOW (ref 3.87–5.11)
RDW: 13.2 % (ref 11.5–15.5)
RDW: 13.3 % (ref 11.5–15.5)
WBC: 6.1 K/uL (ref 4.0–10.5)
WBC: 5.6 K/uL (ref 4.0–10.5)
nRBC: 0 % (ref 0.0–0.2)
nRBC: 0 % (ref 0.0–0.2)

## 2024-12-19 LAB — URINALYSIS, ROUTINE W REFLEX MICROSCOPIC
Bacteria, UA: NONE SEEN
Bilirubin Urine: NEGATIVE
Glucose, UA: NEGATIVE mg/dL
Ketones, ur: 5 mg/dL — AB
Leukocytes,Ua: NEGATIVE
Nitrite: NEGATIVE
Protein, ur: NEGATIVE mg/dL
Specific Gravity, Urine: 1.02 (ref 1.005–1.030)
pH: 5 (ref 5.0–8.0)

## 2024-12-19 MED ORDER — SODIUM CHLORIDE 0.9 % IV SOLN: INTRAVENOUS | Status: DC | PRN

## 2024-12-19 MED ORDER — OXYCODONE HCL 5 MG PO TABS
5.0000 mg | ORAL_TABLET | ORAL | Status: DC | PRN
  Filled 2024-12-19: qty 1

## 2024-12-19 MED ORDER — HYDROCODONE-ACETAMINOPHEN 5-325 MG PO TABS
1.0000 | ORAL_TABLET | ORAL | Status: DC | PRN
  Administered 2024-12-19 – 2024-12-20 (×2): 1 via ORAL
  Filled 2024-12-19 (×2): qty 1

## 2024-12-19 MED ORDER — LIDOCAINE HCL (PF) 2 % IJ SOLN
INTRAMUSCULAR | Status: DC | PRN
  Administered 2024-12-19: 50 mg via INTRADERMAL

## 2024-12-19 MED ORDER — PHENYLEPHRINE HCL (PRESSORS) 10 MG/ML IV SOLN
INTRAVENOUS | Status: DC | PRN
  Administered 2024-12-19: 80 ug via INTRAVENOUS

## 2024-12-19 MED ORDER — AMOXICILLIN-POT CLAVULANATE 875-125 MG PO TABS
1.0000 | ORAL_TABLET | Freq: Two times a day (BID) | ORAL | Status: DC
  Administered 2024-12-19 – 2024-12-20 (×3): 1 via ORAL
  Filled 2024-12-19 (×3): qty 1

## 2024-12-19 MED ORDER — PROPOFOL 500 MG/50ML IV EMUL
INTRAVENOUS | Status: DC | PRN
  Administered 2024-12-19: 100 ug/kg/min via INTRAVENOUS

## 2024-12-19 MED ORDER — SENNA 8.6 MG PO TABS: 1.0000 | ORAL_TABLET | Freq: Every day | ORAL | Status: DC | PRN

## 2024-12-19 NOTE — Anesthesia Preprocedure Evaluation (Signed)
"                                    Anesthesia Evaluation  Patient identified by MRN, date of birth, ID band Patient awake    Reviewed: Allergy  & Precautions, NPO status , Patient's Chart, lab work & pertinent test results  Airway Mallampati: II  TM Distance: >3 FB Neck ROM: Full    Dental  (+) Teeth Intact, Dental Advisory Given   Pulmonary asthma    Pulmonary exam normal breath sounds clear to auscultation       Cardiovascular Exercise Tolerance: Good negative cardio ROS Normal cardiovascular exam Rhythm:Regular Rate:Normal     Neuro/Psych  PSYCHIATRIC DISORDERS Anxiety     negative neurological ROS     GI/Hepatic Neg liver ROS,GERD  ,, gastrointestinal bleeding   Endo/Other  negative endocrine ROS    Renal/GU negative Renal ROS     Musculoskeletal  (+) Arthritis ,    Abdominal   Peds  Hematology  (+) Blood dyscrasia, anemia   Anesthesia Other Findings Day of surgery medications reviewed with the patient.  Reproductive/Obstetrics                              Anesthesia Physical Anesthesia Plan  ASA: 2  Anesthesia Plan: MAC   Post-op Pain Management:    Induction: Intravenous  PONV Risk Score and Plan: 2 and TIVA and Treatment may vary due to age or medical condition  Airway Management Planned: Natural Airway and Simple Face Mask  Additional Equipment:   Intra-op Plan:   Post-operative Plan:   Informed Consent: I have reviewed the patients History and Physical, chart, labs and discussed the procedure including the risks, benefits and alternatives for the proposed anesthesia with the patient or authorized representative who has indicated his/her understanding and acceptance.     Dental advisory given  Plan Discussed with: CRNA and Anesthesiologist  Anesthesia Plan Comments:         Anesthesia Quick Evaluation  "

## 2024-12-19 NOTE — Anesthesia Postprocedure Evaluation (Signed)
"   Anesthesia Post Note  Patient: [name]  Procedure(s) Performed: EGD (ESOPHAGOGASTRODUODENOSCOPY) BIOPSY, GI     Patient location during evaluation: PACU Anesthesia Type: MAC Level of consciousness: awake Pain management: pain level controlled Vital Signs Assessment: post-procedure vital signs reviewed and stable Respiratory status: spontaneous breathing, nonlabored ventilation and respiratory function stable Cardiovascular status: blood pressure returned to baseline and stable Postop Assessment: no apparent nausea or vomiting Anesthetic complications: no   No notable events documented.  Last Vitals:  Vitals:   12/19/24 0859 12/19/24 1141  BP: 112/66 (!) 99/59  Pulse: 87 84  Resp: 18   Temp:  36.6 C  SpO2: 99% 100%    Last Pain:  Vitals:   12/19/24 1141  TempSrc: Oral  PainSc:    Pain Goal:                   Bernardino SQUIBB Effrey Davidow      "

## 2024-12-19 NOTE — Plan of Care (Signed)

## 2024-12-19 NOTE — Op Note (Signed)
 [facility] Patient Name: [name] Procedure Date : 12/19/2024 MRN: [id number] Attending MD: Layla Lah , MD, [id number] Date of Birth: 1961/03/05 CSN: [id number] Age: 64 Admit Type: Inpatient Procedure:                Upper GI endoscopy Indications:              Melena Providers:                Layla Lah, MD, Collene Edu, RN, Lorrayne Kitty,                            Technician Referring MD:              Medicines:                Sedation Administered by an Anesthesia Professional Complications:            No immediate complications. Estimated Blood Loss:     Estimated blood loss was minimal. Procedure:                Pre-Anesthesia Assessment:                           - Prior to the procedure, a History and Physical                            was performed, and patient medications and                            allergies were reviewed. The patient's tolerance of                            previous anesthesia was also reviewed. The risks                            and benefits of the procedure and the sedation                            options and risks were discussed with the patient.                            All questions were answered, and informed consent                            was obtained. Prior Anticoagulants: The patient has                            taken no anticoagulant or antiplatelet agents                            except for aspirin . ASA Grade Assessment: II - A                            patient with mild systemic disease. After reviewing  the risks and benefits, the patient was deemed in                            satisfactory condition to undergo the procedure.                           After obtaining informed consent, the endoscope was                            passed under direct vision. Throughout the                            procedure, the patient's blood pressure, pulse, and                             oxygen saturations were monitored continuously. The                            [id number]-H190 ([id number]) Olympus endoscope was introduced                            through the mouth, and advanced to the second part                            of duodenum. The upper GI endoscopy was                            accomplished without difficulty. The patient                            tolerated the procedure well. Findings:      Patchy, white plaques were found in the upper third of the esophagus.       Biopsies were taken with a cold forceps for histology.      The Z-line was regular and was found 38 cm from the incisors.      A small hiatal hernia was present.      Scattered mild inflammation characterized by congestion (edema),       erosions and erythema was found in the entire examined stomach. Biopsies       were taken with a cold forceps for histology.      The cardia and gastric fundus were normal on retroflexion.      The duodenal bulb, first portion of the duodenum and second portion of       the duodenum were normal.      A few small sessile polyps were found in the gastric body. LIkely Fundic       gland polyps. Impression:               - Esophageal plaques were found, suspicious for                            candidiasis. Biopsied.                           - Z-line regular, 38 cm from the incisors.                           -  Small hiatal hernia.                           - Gastritis. Biopsied.                           - Normal duodenal bulb, first portion of the                            duodenum and second portion of the duodenum.                           - A few gastric polyps. Recommendation:           - Return patient to hospital ward for ongoing care.                           - Soft diet.                           - Continue present medications.                           - Await pathology results.                           - Return to GI clinic in 6  weeks. Procedure Code(s):        --- Professional ---                           [phone], Esophagogastroduodenoscopy, flexible,                            transoral; with biopsy, single or multiple Diagnosis Code(s):        --- Professional ---                           K22.9, Disease of esophagus, unspecified                           K44.9, Diaphragmatic hernia without obstruction or                            gangrene                           K29.70, Gastritis, unspecified, without bleeding                           K31.7, Polyp of stomach and duodenum                           K92.1, Melena (includes Hematochezia) CPT copyright 2022 [employer]. All rights reserved. The codes documented in this report are preliminary and upon coder review may  be revised to meet current compliance requirements. Layla Lah, MD Layla Lah, MD 12/19/2024 8:41:07 AM Number of Addenda: 0

## 2024-12-19 NOTE — Anesthesia Procedure Notes (Signed)
 Procedure Name: MAC Date/Time: 12/19/2024 8:27 AM  Performed by: Arvell Edsel HERO, CRNAPre-anesthesia Checklist: Patient identified, Emergency Drugs available, Patient being monitored, Timeout performed and Suction available Patient Re-evaluated:Patient Re-evaluated prior to induction Oxygen Delivery Method: Simple face mask

## 2024-12-19 NOTE — Progress Notes (Signed)
 "                                                                                                                                                                                                                                                                                PROGRESS NOTE     Patient Demographics:    [name], is a 64 y.o. female, DOB - 1961/05/18, FMW:[id number]  Outpatient Primary MD for the patient is Claudene Lacks, MD    LOS - 1  Admit date - 12/17/2024    Chief Complaint  Patient presents with   Blood In Stools       Brief Narrative (HPI from H&P)    [name] is a 64 y.o. female with medical history significant of anxiety who presents emergency department hematochezia.  Patient had 4 bloody stools and started to feel tired and weak so she presented to emergency department.  She had a history of colonoscopy in 2019 which was reportedly normal.  On arrival to the ER she was febrile hematin medically stable.  Labs were obtained which demonstrated CMP unrevealing, lipase within normal limits, hemoglobin 11 baseline around 14, urinalysis negative for infection.  CT abdomen pelvis was obtained which showed sigmoid diverticulitis and diverticulosis.  Patient was admitted for further workup.    Subjective:   No acute events overnight, she underwent endoscopy this morning.  Bright red blood p.m. today.   Assessment  & Plan :   Assessment & Plan Blood in stool  Diverticulitis  #Acute on chronic anemia most likely secondary to diverticular hemorrhage #Diverticulitis Patient admitted for episode of hematochezia since yesterday.  Describes the BM as burgundy red and bright red blood.  Hemodynamically stable but hemoglobin continues to drop from 13 baseline to 9.1 this morning.  She had recent knee replacement surgery and admits to taking aspirin , Celebrex  since surgery. CT abdomen pelvis with evidence of diverticulosis.  GI evaluated and patient underwent EGD on 01/11  showed mild gastritis, scattered white plaques but no evidence of bleeding.  GI recommended 7 days of antibiotics to treat suspected diverticulitis.  Will hold off on colonoscopy due to diverticulitis.  Will need outpatient GI follow-up in  6 to 8 weeks. -Continue IV PPI 40 mg twice daily -Multimodal pain control -Avoid NSAIDs - Soft diet, advance as tolerated - GI following  #GAD- Continue home Xanax , Wellbutrin    # RLS Discussed with patient to follow-up with sleep medicine to titrate off pramipexole . She is in agreement.  - Will refer to sleep medicine at discharge   Patient Lines/Drains/Airways Status     Active Line/Drains/Airways     Name Placement date Placement time Site Days   Peripheral IV 12/18/24 20 G Left Antecubital 12/18/24  0014  Antecubital  1   Wound 12/18/24 0154 Surgical Closed Surgical Incision Knee Anterior;Right 12/18/24  0154  Knee  1              Nutrition Problem:        Obesity: Estimated body mass index is 25.6 kg/m as calculated from the following:   Height as of this encounter: 5' 2 (1.575 m).   Weight as of this encounter: 63.5 kg.          Condition - Guarded  Code Status : Full code  Consults  : GI  PUD Prophylaxis : PPI       Disposition Plan  :    Status is: Inpatient   DVT Prophylaxis  :    SCDs Start: 12/18/24 0404   Lab Results  Component Value Date   PLT 374 12/19/2024    Diet :  Diet Order             DIET SOFT Fluid consistency: Thin  Diet effective now                    Inpatient Medications  Scheduled Meds:  amoxicillin -clavulanate  1 tablet Oral Q12H   buPROPion   300 mg Oral q morning   pantoprazole  (PROTONIX ) IV  40 mg Intravenous Q12H   pramipexole   0.25 mg Oral QHS   Continuous Infusions: PRN Meds:.acetaminophen  **OR** acetaminophen , ALPRAZolam , ondansetron  **OR** ondansetron  (ZOFRAN ) IV  Antibiotics  :    Anti-infectives (From admission, onward)    Start     Dose/Rate  Route Frequency Ordered Stop   12/19/24 1000  amoxicillin -clavulanate (AUGMENTIN ) 875-125 MG per tablet 1 tablet        1 tablet Oral Every 12 hours 12/19/24 0844 12/26/24 0959   12/18/24 0115  metroNIDAZOLE  (FLAGYL ) IVPB 500 mg        500 mg 100 mL/hr over 60 Minutes Intravenous  Once 12/18/24 0102 12/18/24 0240   12/18/24 0115  ciprofloxacin  (CIPRO ) IVPB 400 mg        400 mg 200 mL/hr over 60 Minutes Intravenous  Once 12/18/24 0102 12/18/24 0346         Objective:   Vitals:   12/19/24 0839 12/19/24 0840 12/19/24 0845 12/19/24 0859  BP:  (!) 100/50 106/69 112/66  Pulse:  (!) 103 100 87  Resp:  19 17 18   Temp: 98.1 F (36.7 C)     TempSrc: Oral     SpO2:  98% 98% 99%  Weight:      Height:        Wt Readings from Last 3 Encounters:  12/17/24 63.5 kg  05/24/24 63.5 kg  12/09/23 68.3 kg     Intake/Output Summary (Last 24 hours) at 12/19/2024 0938 Last data filed at 12/19/2024 0845 Gross per 24 hour  Intake 311.67 ml  Output --  Net 311.67 ml     Physical Exam Constitutional:  Appearance: She is normal weight.  HENT:     Nose: Nose normal.     Mouth/Throat:     Mouth: Mucous membranes are moist.     Pharynx: Oropharynx is clear.  Eyes:     Conjunctiva/sclera: Conjunctivae normal.     Pupils: Pupils are equal, round, and reactive to light.  Cardiovascular:     Rate and Rhythm: Normal rate.     Pulses: Normal pulses.     Heart sounds: Normal heart sounds.  Pulmonary:     Effort: Pulmonary effort is normal.     Breath sounds: Normal breath sounds.  Abdominal:     General: Abdomen is flat. Bowel sounds are normal.  Musculoskeletal:        General: Normal range of motion.     Cervical back: Normal range of motion.  Skin:    General: Skin is warm.     Capillary Refill: Capillary refill takes less than 2 seconds.  Neurological:     General: No focal deficit present.     Mental Status: She is alert. Mental status is at baseline.  Psychiatric:         Mood and Affect: Mood normal.   RN pressure injury documentation:      Data Review:    Recent Labs  Lab 12/17/24 1513 12/18/24 0015 12/18/24 0506 12/18/24 1211 12/18/24 2129 12/19/24 0506  WBC 9.5  --  6.5 9.2 8.3 6.1  HGB 11.0* 10.6* 9.1* 10.2* 9.6* 10.0*  HCT 35.9* 33.8* 29.6* 32.6* 30.7* 31.8*  PLT 438*  --  292 379 352 374  MCV 92.3  --  93.7 91.6 91.1 91.1  MCH 28.3  --  28.8 28.7 28.5 28.7  MCHC 30.6  --  30.7 31.3 31.3 31.4  RDW 13.3  --  13.5 13.3 13.3 13.2  LYMPHSABS 1.6  --   --   --   --   --   MONOABS 0.6  --   --   --   --   --   EOSABS 0.4  --   --   --   --   --   BASOSABS 0.1  --   --   --   --   --     Recent Labs  Lab 12/17/24 1513  NA 138  K 5.0  CL 105  CO2 24  ANIONGAP 10  GLUCOSE 97  BUN 16  CREATININE 0.86  AST 33  ALT 16  ALKPHOS 86  BILITOT 0.3  ALBUMIN 4.0  CALCIUM 9.2      Recent Labs  Lab 12/17/24 1513  CALCIUM 9.2    --------------------------------------------------------------------------------------------------------------- Lab Results  Component Value Date   CHOL 218 (H) 01/04/2021   HDL 69 01/04/2021   LDLCALC 128 (H) 01/04/2021   TRIG 100 01/04/2021   CHOLHDL 3.2 01/04/2021    Lab Results  Component Value Date   HGBA1C 5.5 06/18/2017   No results for input(s): TSH, T4TOTAL, FREET4, T3FREE, THYROIDAB in the last 72 hours. No results for input(s): VITAMINB12, FOLATE, FERRITIN, TIBC, IRON, RETICCTPCT in the last 72 hours. ------------------------------------------------------------------------------------------------------------------ Cardiac Enzymes No results for input(s): CKMB, TROPONINI, MYOGLOBIN in the last 168 hours.  Invalid input(s): CK  Micro Results No results found for this or any previous visit (from the past 240 hours).  Radiology Report CT ABDOMEN PELVIS W CONTRAST Result Date: 12/18/2024 EXAM: CT ABDOMEN AND PELVIS WITH CONTRAST 12/18/2024 12:48:47 AM  TECHNIQUE: CT of the abdomen and pelvis was performed with  the administration of 75 mL of iohexol  (OMNIPAQUE ) 350 MG/ML injection. Multiplanar reformatted images are provided for review. Automated exposure control, iterative reconstruction, and/or weight-based adjustment of the mA/kV was utilized to reduce the radiation dose to as low as reasonably achievable. COMPARISON: None available. CLINICAL HISTORY: Abdominal pain, acute, nonlocalized; GI bleed protocol. FINDINGS: LOWER CHEST: No acute abnormality. LIVER: The liver is unremarkable. GALLBLADDER AND BILE DUCTS: Gallbladder is unremarkable. No biliary ductal dilatation. SPLEEN: No acute abnormality. PANCREAS: No acute abnormality. ADRENAL GLANDS: No acute abnormality. KIDNEYS, URETERS AND BLADDER: No stones in the kidneys or ureters. No hydronephrosis. No perinephric or periureteral stranding. Urinary bladder is unremarkable. GI AND BOWEL: Stomach demonstrates no acute abnormality. Moderate sigmoid diverticulosis. Superimposed pericolonic inflammatory stranding involving the mid sigmoid colon in keeping with mild, uncomplicated sigmoid diverticulitis. No obstruction or perforation. No free intraperitoneal gas or fluid. No loculated pericolonic fluid collections. Appendix absent. The small bowel and large bowel are otherwise unremarkable. PERITONEUM AND RETROPERITONEUM: No ascites. No free air. VASCULATURE: Aorta is normal in caliber. LYMPH NODES: No lymphadenopathy. REPRODUCTIVE ORGANS: Uterus absent. No adnexal masses. BONES AND SOFT TISSUES: Osseous structures are age appropriate. No acute bone abnormality. No lytic or blastic bone lesion. Tiny fat-containing right inguinal hernia. IMPRESSION: 1. Mild, uncomplicated sigmoid diverticulitis without obstruction or perforation. 2. Moderate sigmoid diverticulosis. 3. Status post hysterectomy and appendectomy. Electronically signed by: Dorethia Molt MD MD 12/18/2024 12:56 AM EST RP Workstation: [id number]      Signature  -   Moise FALCON Treasure Ingrum M.D on 12/19/2024 at 9:38 AM   -  To page go to [email or web address]   "

## 2024-12-19 NOTE — Plan of Care (Signed)

## 2024-12-19 NOTE — Brief Op Note (Signed)
 12/19/2024  8:00 AM  8:41 AM  PATIENT:  [name]  64 y.o. female  PRE-OPERATIVE DIAGNOSIS:  gastrointestinal bleeding  POST-OPERATIVE DIAGNOSIS:  Gastritis  Gastric Biopsy r/o H.Pylori  PROCEDURE:  Procedures: EGD (ESOPHAGOGASTRODUODENOSCOPY) (N/A) BIOPSY, GI  SURGEON:  Surgeons and Role:    * Reann Dobias, MD - Primary  Findings ------------ - EGD showed mild gastritis and scattered white plaques in proximal esophagus.  Biopsies taken.  No evidence of active bleeding.  Recommendations -------------------------- - Start soft diet and advance as tolerated - Avoid NSAIDs - Recommend 7 days of antibiotics patient to maintain for possible/ suspected diverticulitis - Not a candidate for colonoscopy at this time with concern for diverticulitis - No further inpatient GI workup planned.  GI will sign off.  Call us  back if needed. - Follow-up with [facility] in 6 to 8 weeks after discharge  Layla Lah MD, FACP 12/19/2024, 8:44 AM  Contact #  [phone]

## 2024-12-19 NOTE — Interval H&P Note (Signed)
 History and Physical Interval Note:  12/19/2024 8:08 AM  [name]  has presented today for surgery, with the diagnosis of gastrointestinal bleeding.  The various methods of treatment have been discussed with the patient and family. After consideration of risks, benefits and other options for treatment, the patient has consented to  Procedures: EGD (ESOPHAGOGASTRODUODENOSCOPY) (N/A) as a surgical intervention.  The patient's history has been reviewed, patient examined, no change in status, stable for surgery.  I have reviewed the patient's chart and labs.  Questions were answered to the patient's satisfaction.     Joanmarie Tsang

## 2024-12-19 NOTE — Transfer of Care (Signed)
 Immediate Anesthesia Transfer of Care Note  Patient: [name]  Procedure(s) Performed: EGD (ESOPHAGOGASTRODUODENOSCOPY) BIOPSY, GI  Patient Location: PACU  Anesthesia Type:MAC  Level of Consciousness: awake, alert , oriented, and patient cooperative  Airway & Oxygen Therapy: Patient Spontanous Breathing and Patient connected to face mask oxygen  Post-op Assessment: Report given to RN, Post -op Vital signs reviewed and stable, Patient moving all extremities, and Patient moving all extremities X 4  Post vital signs: Reviewed and stable  Last Vitals:  Vitals Value Taken Time  BP 106/69 12/19/24 08:45  Temp 36.7 C 12/19/24 08:39  Pulse 103 12/19/24 08:45  Resp 18 12/19/24 08:45  SpO2 98 % 12/19/24 08:45  Vitals shown include unfiled device data.  Last Pain:  Vitals:   12/19/24 0840  TempSrc:   PainSc: 0-No pain         Complications: No notable events documented.

## 2024-12-20 ENCOUNTER — Encounter (HOSPITAL_COMMUNITY): Payer: Self-pay | Admitting: Gastroenterology

## 2024-12-20 LAB — HEMOGLOBIN AND HEMATOCRIT, BLOOD
HCT: 30.3 % — ABNORMAL LOW (ref 36.0–46.0)
Hemoglobin: 9.5 g/dL — ABNORMAL LOW (ref 12.0–15.0)

## 2024-12-20 MED ORDER — PANTOPRAZOLE SODIUM 40 MG PO TBEC: 40.0000 mg | DELAYED_RELEASE_TABLET | Freq: Two times a day (BID) | ORAL | Status: DC

## 2024-12-20 MED ORDER — AMOXICILLIN-POT CLAVULANATE 875-125 MG PO TABS: 1.0000 | ORAL_TABLET | Freq: Two times a day (BID) | ORAL | 0 refills | Status: AC

## 2024-12-20 MED ORDER — PANTOPRAZOLE SODIUM 40 MG PO TBEC: 40.0000 mg | DELAYED_RELEASE_TABLET | Freq: Two times a day (BID) | ORAL | 0 refills | Status: AC

## 2024-12-20 NOTE — Discharge Instructions (Signed)
"   Advised to follow up PCP in one week Advised to take Augmentin  twice daily for 6 days for diverticulitis. Advised to take pantoprazole  bid . "

## 2024-12-20 NOTE — TOC CM/SW Note (Signed)
 Transition of Care [facility]) - Inpatient Brief Assessment   Patient Details  Name: [name] MRN: [id number] Date of Birth: Jul 23, 1961  Transition of [facility] CM/SW Contact:    Roxie KANDICE Stain, RN Phone Number: 12/20/2024, 10:02 AM   Clinical Narrative: Patient admitted for bloody stools. Patient had prior knee replacement November 18, 2024. Patient wants to continue outpatient therapy at [facility].  ICM (Inpatient Care Management) will continue to follow, no needs anticipated at this time.    Transition of Care Asessment: Insurance and Status: Insurance coverage has been reviewed Patient has primary care physician: Yes Home environment has been reviewed: safe to discharge home Prior level of function:: Prior Knee replacement on November 19, 2024 Prior/Current Home Services: No current home services Social Drivers of Health Review: SDOH reviewed no interventions necessary Readmission risk has been reviewed: Yes Transition of care needs: no transition of care needs at this time

## 2024-12-20 NOTE — Plan of Care (Signed)

## 2024-12-20 NOTE — Progress Notes (Signed)
 Pt complaining of chest pain. MD Timothy Opyd notified. Obtained STAT EKG @ bedside. Resulted with NSR. Physical copy in chart.

## 2024-12-20 NOTE — Discharge Summary (Signed)
 Physician Discharge Summary  [name] FMW:[id number] DOB: 12-Mar-1961 DOA: 12/17/2024  PCP: Claudene Lacks, MD  Admit date: 12/17/2024  Discharge date: 12/20/2024  Admitted From: Home.  Disposition:  Home.  Recommendations for Outpatient Follow-up:  Follow up with PCP in 1-2 weeks. Please obtain BMP/CBC in one week. Advised to take Augmentin  twice daily for 6 days for diverticulitis. Advised to take pantoprazole  40 mg bid .  Home Health: None Equipment/Devices:None  Discharge Condition: Stable CODE STATUS:Full code Diet recommendation: Heart Healthy   Brief [facility] Course: This 64 y.o. female with medical history significant for anxiety who presents emergency department for hematochezia.  Patient had 4 bloody stools and started to feel tired and weak so she presented to emergency department.  She had a history of colonoscopy in 2019 which was reportedly normal.  On arrival to the ER she was afebrile hemodynamically stable.  Labs were obtained which demonstrated CMP unrevealing, lipase within normal limits, hemoglobin 11 baseline around 14, urinalysis negative for infection. CT abdomen pelvis was obtained which showed sigmoid diverticulitis and diverticulosis. Patient was admitted for further workup.  Patient was continued on IV antibiotics. GI was consulted.  Patient underwent EGD which showed mild gastritis, scattered white plaques but no evidence of bleeding.  GI recommended 7 days of oral antibiotics to treat suspected diverticulitis.  Will hold off on colonoscopy due to diverticulitis.  Needs outpatient follow-up in 6 to 8 weeks.  Patient felt much better, H&H remains stable. Patient tolerated soft diet and wants to be discharged home.  Discharge Diagnoses:  Principal Problem:   Blood in stool Active Problems:   Diverticulitis  Acute on chronic anemia most likely secondary to diverticular hemorrhage: Diverticulitis Patient admitted for episode of hematochezia since  yesterday.  Describes the BM as burgundy red and bright red blood.  Hemodynamically stable but hemoglobin continues to drop from 13 baseline to 9.1 this morning.  She had recent knee replacement surgery and admits to taking aspirin , Celebrex  since surgery. CT abdomen pelvis with evidence of diverticulosis.  GI evaluated and patient underwent EGD on 01/11 showed mild gastritis, scattered white plaques but no evidence of bleeding.  GI recommended 7 days of antibiotics to treat suspected diverticulitis.  Will hold off on colonoscopy due to diverticulitis.  Will need outpatient GI follow-up in 6 to 8 weeks. Continue  PPI 40 mg twice daily Multimodal pain control Avoid NSAIDs Soft diet, advance as tolerated Feels better,  wants to be discharged home.   GAD- Continue home Xanax , Wellbutrin    RLS Discussed with patient to follow-up with sleep medicine to titrate off pramipexole . She is in agreement.  - Will refer to sleep medicine at discharge.  Discharge Instructions  Discharge Instructions     Call MD for:  difficulty breathing, headache or visual disturbances   Complete by: As directed    Call MD for:  persistant nausea and vomiting   Complete by: As directed    Diet general   Complete by: As directed    Discharge instructions   Complete by: As directed    Advised to follow up PCP in one week Advised to take Augmentin  twice daily for 6 days for diverticulitis. Advised to take pantoprazole  bid .   Increase activity slowly   Complete by: As directed    No wound care   Complete by: As directed       Allergies as of 12/20/2024       Reactions   Buspirone Other (See Comments)  Celexa [citalopram]    Other reaction(s): breast soreness   Cephalexin    Clonazepam    Other reaction(s): blurred vision and dry eyes   Decongestant [pseudoephedrine Hcl Er]    Heart races   Erythromycin    Lorazepam     Other reaction(s): cognitive dysfunction   Mupirocin    Other reaction(s): contact  drmatitis   Neomycin    Other reaction(s): contact dermatitis   Other Other (See Comments), Itching   Other reaction(s): breast soreness Other reaction(s): heart racing   Petrol Dist-piperonyl Butoxide-pyrethrins [pyrethrins-piperonyl Butoxide]    Pramipexole  Dihydrochloride    Other reaction(s): cataplexy   Promethazine Hcl Other (See Comments)   Restless legs   Restasis [cyclosporine] Other (See Comments)   Dry eyes   Zoloft [sertraline]    Other reaction(s): heart racing   Bacitracin-neomycin-polymyxin Rash   REACTION: RASH REACTION: RASH   Codeine Other (See Comments)   Makes pt agitated. codeine        Medication List     STOP taking these medications    aspirin  EC 81 MG tablet   celecoxib  200 MG capsule Commonly known as: CELEBREX    RABEprazole 20 MG tablet Commonly known as: ACIPHEX       TAKE these medications    pramipexole  0.5 MG tablet Commonly known as: MIRAPEX  Take 0.25 mg by mouth at bedtime. The timing of this medication is very important.   albuterol 108 (90 Base) MCG/ACT inhaler Commonly known as: VENTOLIN HFA Inhale 2 puffs into the lungs every 4 (four) hours as needed.   ALIGN PREBIOTIC-PROBIOTIC PO Take 1 tablet by mouth daily.   ALPRAZolam  0.25 MG tablet Commonly known as: XANAX  Take by mouth at bedtime as needed for sleep or anxiety.   amoxicillin -clavulanate 875-125 MG tablet Commonly known as: AUGMENTIN  Take 1 tablet by mouth every 12 (twelve) hours for 6 days.   Breo Ellipta 200-25 MCG/INH Aepb Generic drug: fluticasone furoate-vilanterol Inhale 1 puff into the lungs daily.   buPROPion  300 MG 24 hr tablet Commonly known as: WELLBUTRIN  XL Take 300 mg by mouth daily.   cetirizine 10 MG tablet Commonly known as: ZYRTEC Take 10 mg by mouth as needed.   FIBER GUMMIES PO Take 4 tablets by mouth at bedtime.   Geri-kot 8.6 MG tablet Generic drug: senna Take 2 tablets (17.2 mg total) by mouth at bedtime for 14 days.    glycerin adult 2 g suppository Place 1 suppository rectally as needed for constipation.   HYDROcodone -acetaminophen  5-325 MG tablet Commonly known as: NORCO/VICODIN Take 1 tablet by mouth every 4 (four) hours as needed for severe post op pain   IRON PO Take 65 mg by mouth in the morning and at bedtime.   methocarbamol  500 MG tablet Commonly known as: ROBAXIN  Take 1 tablet (500 mg total) by mouth every 6 (six) hours as needed for muscle spasm/muscle pain.   pantoprazole  40 MG tablet Commonly known as: PROTONIX  Take 1 tablet (40 mg total) by mouth 2 (two) times daily.   polyethylene glycol powder 17 GM/SCOOP powder Commonly known as: GLYCOLAX/MIRALAX Take 17 g by mouth 2 (two) times daily.   tretinoin 0.1 % cream Commonly known as: RETIN-A Apply 1 Application topically daily.   VITAMIN D  PO Take 1 capsule by mouth daily.        Follow-up Information     Gastroenterology, [facility]. Schedule an appointment as soon as possible for a visit in 6 week(s).   Why: Follow-up for diverticulitis and GI bleed  Contact information: [address] [place] [address] [phone]         Claudene Lacks, MD Follow up in 1 week(s).   Specialty: Family Medicine Contact information: [address] [place] [address] [phone]                Allergies[1]  Consultations: Gastroenterology    Procedures/Studies: CT ABDOMEN PELVIS W CONTRAST Result Date: 12/18/2024 EXAM: CT ABDOMEN AND PELVIS WITH CONTRAST 12/18/2024 12:48:47 AM TECHNIQUE: CT of the abdomen and pelvis was performed with the administration of 75 mL of iohexol  (OMNIPAQUE ) 350 MG/ML injection. Multiplanar reformatted images are provided for review. Automated exposure control, iterative reconstruction, and/or weight-based adjustment of the mA/kV was utilized to reduce the radiation dose to as low as reasonably achievable. COMPARISON: None available. CLINICAL HISTORY: Abdominal pain, acute,  nonlocalized; GI bleed protocol. FINDINGS: LOWER CHEST: No acute abnormality. LIVER: The liver is unremarkable. GALLBLADDER AND BILE DUCTS: Gallbladder is unremarkable. No biliary ductal dilatation. SPLEEN: No acute abnormality. PANCREAS: No acute abnormality. ADRENAL GLANDS: No acute abnormality. KIDNEYS, URETERS AND BLADDER: No stones in the kidneys or ureters. No hydronephrosis. No perinephric or periureteral stranding. Urinary bladder is unremarkable. GI AND BOWEL: Stomach demonstrates no acute abnormality. Moderate sigmoid diverticulosis. Superimposed pericolonic inflammatory stranding involving the mid sigmoid colon in keeping with mild, uncomplicated sigmoid diverticulitis. No obstruction or perforation. No free intraperitoneal gas or fluid. No loculated pericolonic fluid collections. Appendix absent. The small bowel and large bowel are otherwise unremarkable. PERITONEUM AND RETROPERITONEUM: No ascites. No free air. VASCULATURE: Aorta is normal in caliber. LYMPH NODES: No lymphadenopathy. REPRODUCTIVE ORGANS: Uterus absent. No adnexal masses. BONES AND SOFT TISSUES: Osseous structures are age appropriate. No acute bone abnormality. No lytic or blastic bone lesion. Tiny fat-containing right inguinal hernia. IMPRESSION: 1. Mild, uncomplicated sigmoid diverticulitis without obstruction or perforation. 2. Moderate sigmoid diverticulosis. 3. Status post hysterectomy and appendectomy. Electronically signed by: Dorethia Molt MD MD 12/18/2024 12:56 AM EST RP Workstation: [id number]   CT CARDIAC SCORING (SELF PAY ONLY) Addendum Date: 12/16/2024 ADDENDUM REPORT: 12/16/2024 13:18 EXAM: OVER-READ INTERPRETATION  CT CHEST The following report is an over-read performed by radiologist Dr. Juliene Cramp [facility] on 12/16/2024. This over-read does not include interpretation of cardiac or coronary anatomy or pathology. The coronary calcium score interpretation by the cardiologist is attached. COMPARISON:  None.  FINDINGS: Visualized mediastinal structures are normal. Images of the upper abdomen are unremarkable. Tiny nodular densities in the posterior right lower lobe on images 81 and 80, sequence 2 measuring approximately 2 mm. These are nonspecific but likely incidental. No significant airspace disease or consolidation in the visualized lungs. No large pleural effusions. No acute bone abnormality. IMPRESSION: 1. No acute extracardiac findings. 2. Tiny nodular densities at the right lung base. No follow-up needed if patient is low-risk. Non-contrast chest CT can be considered in 12 months if patient is high-risk. This recommendation follows the consensus statement: Guidelines for Management of Incidental Pulmonary Nodules Detected on CT Images: From the Fleischner Society 2017; Radiology 2017; 284:228-243. Electronically Signed   By: Juliene Balder M.D.   On: 12/16/2024 13:18   Result Date: 12/16/2024 CLINICAL DATA:  38F for cardiovascular disease risk stratification EXAM: Coronary Calcium Score TECHNIQUE: A gated, non-contrast computed tomography scan of the heart was performed using 3mm slice thickness. Axial images were analyzed on a dedicated workstation. Calcium scoring of the coronary arteries was performed using the Agatston method. FINDINGS: Coronary Calcium Score: 0 Pericardium: Normal. Non-cardiac:  See separate report from [facility]. IMPRESSION: 1. Coronary calcium score of 0. This was 1st percentile for age-, race-, and sex-matched controls. RECOMMENDATIONS: Coronary artery calcium (CAC) score is a strong predictor of incident coronary heart disease (CHD) and provides predictive information beyond traditional risk factors. CAC scoring is reasonable to use in the decision to withhold, postpone, or initiate statin therapy in intermediate-risk or selected borderline-risk asymptomatic adults (age 82-75 years and LDL-C >=70 to <190 mg/dL) who do not have diabetes or established atherosclerotic cardiovascular  disease (ASCVD).* In intermediate-risk (10-year ASCVD risk >=7.5% to <20%) adults or selected borderline-risk (10-year ASCVD risk >=5% to <7.5%) adults in whom a CAC score is measured for the purpose of making a treatment decision the following recommendations have been made: If CAC=0, it is reasonable to withhold statin therapy and reassess in 5 to 10 years, as long as higher risk conditions are absent (diabetes mellitus, family history of premature CHD in first degree relatives (males <55 years; females <65 years), cigarette smoking, or LDL >=190 mg/dL). If CAC is 1 to 99, it is reasonable to initiate statin therapy for patients >=41 years of age. If CAC is >=100 or >=75th percentile, it is reasonable to initiate statin therapy at any age. Cardiology referral should be considered for patients with CAC scores >=400 or >=75th percentile. *2018 AHA/ACC/AACVPR/AAPA/ABC/ACPM/ADA/AGS/APhA/ASPC/NLA/PCNA Guideline on the Management of Blood Cholesterol: A Report of the American College of Cardiology/American Heart Association Task Force on Clinical Practice Guidelines. J Am Coll Cardiol. 2019;73(24):3168-3209. Annabella Scarce, MD Electronically Signed: By: Annabella Scarce M.D. On: 11/29/2024 16:30   DG BONE DENSITY (DXA) Result Date: 11/29/2024 EXAM: DUAL X-RAY ABSORPTIOMETRY (DXA) FOR BONE MINERAL DENSITY 11/29/2024 8:33 am CLINICAL DATA:  64 year old Female Postmenopausal. M81.0-Age-related osteoporosis without a current pathological fracture History of fragility fracture. Patient is or has been on bone building therapies. TECHNIQUE: An axial (e.g., hips, spine) and/or appendicular (e.g., radius) exam was performed, as appropriate, using GE Secretary/administrator at [employer]. Images are obtained for bone mineral density measurement and are not obtained for diagnostic purposes. [id number] Exclusions: None. COMPARISON:  None. New baseline. FINDINGS: Scan quality: Good. LUMBAR SPINE  (L1-L4): BMD (in g/cm2): 1.048 T-score: -1.2 Z-score: 0.4 LEFT FEMORAL NECK: BMD (in g/cm2): 0.743 T-score: -2.1 Z-score: -0.7 LEFT TOTAL HIP: BMD (in g/cm2): 0.824 T-score: -1.5 Z-score: -0.3 RIGHT FEMORAL NECK: BMD (in g/cm2): 0.808 T-score: -1.7 Z-score: -0.2 RIGHT TOTAL HIP: BMD (in g/cm2): 0.839 T-score: -1.3 Z-score: -0.2 FRAX 10-YEAR PROBABILITY OF FRACTURE: FRAX not reported as the patient is receiving bone building therapy. IMPRESSION: Osteopenia based on BMD. Fracture risk is unknown due to history of bone building therapy. RECOMMENDATIONS: 1. All patients should optimize calcium and vitamin D  intake. 2. Consider FDA-approved medical therapies in postmenopausal women and men aged 20 years and older, based on the following: - A hip or vertebral (clinical or morphometric) fracture - T-score less than or equal to -2.5 and secondary causes have been excluded. - Low bone mass (T-score between -1.0 and -2.5) and a 10-year probability of a hip fracture greater than or equal to 3% or a 10-year probability of a major osteoporosis-related fracture greater than or equal to 20% based on the [place] -adapted WHO algorithm. - Clinician judgment and/or patient preferences may indicate treatment for people with 10-year fracture probabilities above or below these levels 3. Patients with diagnosis of osteoporosis or at high risk for fracture should have regular bone mineral density tests. For patients eligible for  Medicare, routine testing is allowed once every 2 years. The testing frequency can be increased to one year for patients who have rapidly progressing disease, those who are receiving or discontinuing medical therapy to restore bone mass, or have additional risk factors. Electronically Signed   By: Harrietta Sherry M.D.   On: 11/29/2024 10:05    Subjective: Patient was seen and examined at bedside.  Overnight events noted. Patient reports doing much better , wants to be discharged home.  H&H remains  stable.  Discharge Exam: Vitals:   12/20/24 0845 12/20/24 1453  BP: 126/73 128/63  Pulse: 81 81  Resp: 16 16  Temp: 98 F (36.7 C) 98 F (36.7 C)  SpO2: 100% 100%   Vitals:   12/20/24 0031 12/20/24 0443 12/20/24 0845 12/20/24 1453  BP: (!) 110/55 114/63 126/73 128/63  Pulse: 85 83 81 81  Resp: 18 18 16 16   Temp: 97.9 F (36.6 C) 98.1 F (36.7 C) 98 F (36.7 C) 98 F (36.7 C)  TempSrc: Oral Oral Oral Oral  SpO2: 95% 98% 100% 100%  Weight:      Height:        General: Pt is alert, awake, not in acute distress Cardiovascular: RRR, S1/S2 +, no rubs, no gallops Respiratory: CTA bilaterally, no wheezing, no rhonchi Abdominal: Soft, NT, ND, bowel sounds + Extremities: no edema, no cyanosis    The results of significant diagnostics from this hospitalization (including imaging, microbiology, ancillary and laboratory) are listed below for reference.     Microbiology: No results found for this or any previous visit (from the past 240 hours).   Labs: BNP (last 3 results) No results for input(s): BNP in the last 8760 hours. Basic Metabolic Panel: Recent Labs  Lab 12/17/24 1513  NA 138  K 5.0  CL 105  CO2 24  GLUCOSE 97  BUN 16  CREATININE 0.86  CALCIUM 9.2   Liver Function Tests: Recent Labs  Lab 12/17/24 1513  AST 33  ALT 16  ALKPHOS 86  BILITOT 0.3  PROT 6.7  ALBUMIN 4.0   Recent Labs  Lab 12/17/24 1513  LIPASE 14   No results for input(s): AMMONIA in the last 168 hours. CBC: Recent Labs  Lab 12/17/24 1513 12/18/24 0015 12/18/24 0506 12/18/24 1211 12/18/24 2129 12/19/24 0506 12/19/24 1222 12/20/24 1031  WBC 9.5  --  6.5 9.2 8.3 6.1 5.6  --   NEUTROABS 6.8  --   --   --   --   --   --   --   HGB 11.0*   < > 9.1* 10.2* 9.6* 10.0* 9.9* 9.5*  HCT 35.9*   < > 29.6* 32.6* 30.7* 31.8* 31.9* 30.3*  MCV 92.3  --  93.7 91.6 91.1 91.1 91.7  --   PLT 438*  --  292 379 352 374 356  --    < > = values in this interval not displayed.   Cardiac  Enzymes: No results for input(s): CKTOTAL, CKMB, CKMBINDEX, TROPONINI in the last 168 hours. BNP: Invalid input(s): POCBNP CBG: No results for input(s): GLUCAP in the last 168 hours. D-Dimer No results for input(s): DDIMER in the last 72 hours. Hgb A1c No results for input(s): HGBA1C in the last 72 hours. Lipid Profile No results for input(s): CHOL, HDL, LDLCALC, TRIG, CHOLHDL, LDLDIRECT in the last 72 hours. Thyroid function studies No results for input(s): TSH, T4TOTAL, T3FREE, THYROIDAB in the last 72 hours.  Invalid input(s): FREET3 Anemia work up No  results for input(s): VITAMINB12, FOLATE, FERRITIN, TIBC, IRON, RETICCTPCT in the last 72 hours. Urinalysis    Component Value Date/Time   COLORURINE YELLOW 12/19/2024 1440   APPEARANCEUR CLEAR 12/19/2024 1440   LABSPEC 1.020 12/19/2024 1440   PHURINE 5.0 12/19/2024 1440   GLUCOSEU NEGATIVE 12/19/2024 1440   HGBUR SMALL (A) 12/19/2024 1440   BILIRUBINUR NEGATIVE 12/19/2024 1440   KETONESUR 5 (A) 12/19/2024 1440   PROTEINUR NEGATIVE 12/19/2024 1440   UROBILINOGEN 0.2 04/19/2015 0857   NITRITE NEGATIVE 12/19/2024 1440   LEUKOCYTESUR NEGATIVE 12/19/2024 1440   Sepsis Labs Recent Labs  Lab 12/18/24 1211 12/18/24 2129 12/19/24 0506 12/19/24 1222  WBC 9.2 8.3 6.1 5.6   Microbiology No results found for this or any previous visit (from the past 240 hours).   Time coordinating discharge: Over 30 minutes  SIGNED:   Darcel Dawley, MD  [facility] 12/20/2024, 4:38 PM Pager   If 7PM-7AM, please contact night-coverage     [1]  Allergies Allergen Reactions   Buspirone Other (See Comments)   Celexa [Citalopram]     Other reaction(s): breast soreness   Cephalexin    Clonazepam     Other reaction(s): blurred vision and dry eyes   Decongestant [Pseudoephedrine Hcl Er]     Heart races   Erythromycin    Lorazepam      Other reaction(s): cognitive dysfunction    Mupirocin     Other reaction(s): contact drmatitis   Neomycin     Other reaction(s): contact dermatitis   Other Other (See Comments) and Itching    Other reaction(s): breast soreness Other reaction(s): heart racing   Petrol Dist-Piperonyl Butoxide-Pyrethrins [Pyrethrins-Piperonyl Butoxide]    Pramipexole  Dihydrochloride     Other reaction(s): cataplexy   Promethazine Hcl Other (See Comments)    Restless legs   Restasis [Cyclosporine] Other (See Comments)    Dry eyes   Zoloft [Sertraline]     Other reaction(s): heart racing   Bacitracin-Neomycin-Polymyxin Rash    REACTION: RASH REACTION: RASH   Codeine Other (See Comments)    Makes pt agitated.  codeine

## 2024-12-21 LAB — SURGICAL PATHOLOGY

## 2024-12-28 ENCOUNTER — Other Ambulatory Visit (HOSPITAL_BASED_OUTPATIENT_CLINIC_OR_DEPARTMENT_OTHER): Payer: Self-pay

## 2024-12-28 MED ORDER — PREDNISONE 10 MG PO TABS
ORAL_TABLET | ORAL | 0 refills | Status: AC
  Filled 2024-12-28: qty 21, 6d supply, fill #0

## 2025-01-04 ENCOUNTER — Other Ambulatory Visit (HOSPITAL_COMMUNITY): Payer: Self-pay | Admitting: Family Medicine

## 2025-01-04 ENCOUNTER — Telehealth: Payer: Self-pay | Admitting: Pharmacy Technician

## 2025-01-04 NOTE — Progress Notes (Signed)
 Received Reclast  referral  Last doses: 11/28/2022 and 12/09/2023  Creatinine and calcium wnl on 12/29/2024  Sherry Pennant, PharmD, MPH, BCPS, CPP Clinical Pharmacist

## 2025-01-04 NOTE — Telephone Encounter (Signed)
" °  [name], the patient will be scheduled as soon as possible.   Auth Submission: NO AUTH NEEDED Site of care: Site of care: [facility] Payer: [employer] Medication & CPT/J Code(s) submitted: Reclast  (Zolendronic acid) J3489 Diagnosis Code: M81.0 Route of submission (phone, fax, portal): phone Phone # [phone] Fax # Auth type: Buy/Bill PB Units/visits requested: 5mg  x 1 Reference number: [id number] 3:17PM Approval from: 01/04/2025 to 12/08/2025        "

## 2025-02-03 ENCOUNTER — Ambulatory Visit
